# Patient Record
Sex: Female | Born: 1980 | Race: White | Hispanic: No | Marital: Married | Smoking: Never smoker
Health system: Southern US, Community
[De-identification: ages and names within clinical notes are randomized; demographics above are authoritative.]

## PROBLEM LIST (undated history)

## (undated) ENCOUNTER — Inpatient Hospital Stay (HOSPITAL_COMMUNITY): Payer: Self-pay

## (undated) DIAGNOSIS — Z5189 Encounter for other specified aftercare: Secondary | ICD-10-CM

## (undated) HISTORY — PX: WISDOM TOOTH EXTRACTION: SHX21

## (undated) HISTORY — DX: Encounter for other specified aftercare: Z51.89

## (undated) HISTORY — PX: NO PAST SURGERIES: SHX2092

---

## 2011-02-16 LAB — ABO/RH: RH Type: POSITIVE

## 2011-02-16 LAB — HEPATITIS B SURFACE ANTIGEN: Hepatitis B Surface Ag: NEGATIVE

## 2011-02-16 LAB — RUBELLA ANTIBODY, IGM: Rubella: NON-IMMUNE/NOT IMMUNE

## 2011-07-17 ENCOUNTER — Inpatient Hospital Stay (HOSPITAL_COMMUNITY)
Admission: AD | Admit: 2011-07-17 | Discharge: 2011-07-17 | Disposition: A | Discharge status: Home / Self Care | Payer: BC Managed Care – PPO | Source: Ambulatory Visit | Attending: Obstetrics and Gynecology | Admitting: Obstetrics and Gynecology

## 2011-07-17 ENCOUNTER — Encounter (HOSPITAL_COMMUNITY): Payer: Self-pay | Admitting: *Deleted

## 2011-07-17 DIAGNOSIS — O368190 Decreased fetal movements, unspecified trimester, not applicable or unspecified: Secondary | ICD-10-CM | POA: Insufficient documentation

## 2011-07-17 NOTE — ED Provider Notes (Signed)
History   30 yo G1P0 at 62 6/7 weeks presented with decreased fetal movement last 2 days.  Has felt movement,  "just not as much or as strong".  Denies contractions, leaking, or bleeding.  Has next visit at [facility] on Monday.    Pregnancy remarkable for:  Rubella non-immune  Chief Complaint  Patient presents with  . Decreased Fetal Movement     OB History    Grav Para Term Preterm Abortions TAB SAB Ect Mult Living   1             Attempted pregnancy X 1 1/2 years before conception  Past Medical History  Diagnosis Date  . No pertinent past medical history   Usual childhood illnesses UTI x1  Past Surgical History  Procedure Date  . No past surgeries   Wisdom teeth age 84 Tubes in ears as child  MGF Alzheimers, diabetes, heart disease  History  Substance Use Topics  . Smoking status: Never Smoker   . Smokeless tobacco: Not on file  . Alcohol Use: No    Allergies: No Known Allergies  Prescriptions prior to admission  Medication Sig Dispense Refill  . acetaminophen (TYLENOL) 325 MG tablet Take 650 mg by mouth every 6 (six) hours as needed.        . calcium carbonate (TUMS - DOSED IN MG ELEMENTAL CALCIUM) 500 MG chewable tablet Chew 1 tablet by mouth daily as needed. For heartburn.       . prenatal vitamin w/FE, FA (PRENATAL 1 + 1) 27-1 MG TABS Take 1 tablet by mouth daily.        . Ranitidine HCl (ZANTAC PO) Take 1 tablet by mouth daily as needed. For heartburn.         ROS:  Denies SOB, chest pain, leaking, bleeding.  Reports occasional low back pain, reflux, occasional nausea related to reflux.  Physical Exam   Blood pressure 121/84, pulse 63, temperature 97.9 F (36.6 C), temperature source Oral, resp. rate 18, height 5\' 1"  (1.549 m), weight 85.276 kg (188 lb). Chest clear Heart RRR without murmur Abd Gravid, NT Back Negative CVAT Pelvic deferred Ext WNL  FHR reactive, with audible fetal movement No UCs  Bedside U/S--vtx, multiple episodes of fetal  movement.  Patient now aware of FM.  Fluid appears adequate, anterior placenta  ED Course  IUP at 31 6/7 week Appropriate FM--patient now aware of FM  Plan: D/C home Monitor [facility]KC--call prn Keep scheduled appointment at [facility] on Monday.  [name], CNM 07/17/11 1340

## 2011-07-17 NOTE — Progress Notes (Signed)
Decreased fetal movement.  Low back ache started about an hour ago.  No bleeding or leaking, taken directly to rm

## 2011-07-17 NOTE — Progress Notes (Signed)
Pt in c/o decreased fetal movement x 2 days.  Reports lower back pain and increased discharge.  Denies any bleeding or leaking of fluid.

## 2011-07-27 ENCOUNTER — Inpatient Hospital Stay (HOSPITAL_COMMUNITY)
Admission: AD | Admit: 2011-07-27 | Discharge: 2011-08-02 | DRG: 372 | Disposition: A | Discharge status: Home / Self Care | Payer: BC Managed Care – PPO | Source: Ambulatory Visit | Attending: Obstetrics and Gynecology | Admitting: Obstetrics and Gynecology

## 2011-07-27 ENCOUNTER — Ambulatory Visit (HOSPITAL_COMMUNITY): Payer: BC Managed Care – PPO

## 2011-07-27 ENCOUNTER — Encounter (HOSPITAL_COMMUNITY): Payer: Self-pay | Admitting: *Deleted

## 2011-07-27 ENCOUNTER — Inpatient Hospital Stay (HOSPITAL_COMMUNITY): Payer: BC Managed Care – PPO

## 2011-07-27 DIAGNOSIS — O1490 Unspecified pre-eclampsia, unspecified trimester: Secondary | ICD-10-CM | POA: Diagnosis present

## 2011-07-27 DIAGNOSIS — O1414 Severe pre-eclampsia complicating childbirth: Principal | ICD-10-CM | POA: Diagnosis present

## 2011-07-27 DIAGNOSIS — IMO0002 Reserved for concepts with insufficient information to code with codable children: Secondary | ICD-10-CM | POA: Diagnosis present

## 2011-07-27 DIAGNOSIS — O4290 Premature rupture of membranes, unspecified as to length of time between rupture and onset of labor, unspecified weeks of gestation: Secondary | ICD-10-CM | POA: Diagnosis present

## 2011-07-27 DIAGNOSIS — O139 Gestational [pregnancy-induced] hypertension without significant proteinuria, unspecified trimester: Secondary | ICD-10-CM | POA: Diagnosis present

## 2011-07-27 HISTORY — DX: Unspecified pre-eclampsia, unspecified trimester: O14.90

## 2011-07-27 LAB — CBC
MCH: 30 pg (ref 26.0–34.0)
Platelets: 215 10*3/uL (ref 150–400)
RBC: 4.06 MIL/uL (ref 3.87–5.11)
RDW: 13.1 % (ref 11.5–15.5)

## 2011-07-27 LAB — STREP B DNA PROBE: GBS: NEGATIVE

## 2011-07-27 LAB — COMPREHENSIVE METABOLIC PANEL
Alkaline Phosphatase: 76 U/L (ref 39–117)
BUN: 11 mg/dL (ref 6–23)
GFR calc Af Amer: >90 mL/min (ref >90)
GFR calc non Af Amer: >90 mL/min (ref >90)
Glucose, Bld: 82 mg/dL (ref 70–99)
Potassium: 4.4 mEq/L (ref 3.5–5.1)
Total Protein: 6.3 g/dL (ref 6.0–8.3)

## 2011-07-27 MED ORDER — SODIUM CHLORIDE 0.9 % IV SOLN
2.0000 g | Freq: Four times a day (QID) | INTRAVENOUS | Status: AC
  Administered 2011-07-27 – 2011-07-29 (×8): 2 g via INTRAVENOUS
  Filled 2011-07-27 (×9): qty 2000

## 2011-07-27 MED ORDER — LABETALOL HCL 200 MG PO TABS
200.0000 mg | ORAL_TABLET | Freq: Two times a day (BID) | ORAL | Status: DC
  Administered 2011-07-27 – 2011-07-31 (×9): 200 mg via ORAL
  Filled 2011-07-27 (×10): qty 1

## 2011-07-27 MED ORDER — LACTATED RINGERS IV SOLN
INTRAVENOUS | Status: DC
  Administered 2011-07-28: via INTRAVENOUS
  Administered 2011-07-30: 1000 mL via INTRAVENOUS

## 2011-07-27 MED ORDER — SODIUM CHLORIDE 0.9 % IJ SOLN
3.0000 mL | INTRAMUSCULAR | Status: DC | PRN
  Administered 2011-07-28 (×2): 3 mL via INTRAVENOUS

## 2011-07-27 MED ORDER — DEXTROSE 5 % IV SOLN
500.0000 mg | Freq: Once | INTRAVENOUS | Status: AC
  Administered 2011-07-27: 500 mg via INTRAVENOUS
  Filled 2011-07-27: qty 500

## 2011-07-27 MED ORDER — PRENATAL PLUS 27-1 MG PO TABS
1.0000 | ORAL_TABLET | Freq: Every day | ORAL | Status: DC
Start: 2011-07-27 — End: 2011-07-27

## 2011-07-27 MED ORDER — AMOXICILLIN 500 MG PO CAPS
500.0000 mg | ORAL_CAPSULE | Freq: Three times a day (TID) | ORAL | Status: DC
  Administered 2011-07-29 – 2011-07-30 (×4): 500 mg via ORAL
  Filled 2011-07-27 (×6): qty 1

## 2011-07-27 MED ORDER — ACETAMINOPHEN 325 MG PO TABS
650.0000 mg | ORAL_TABLET | ORAL | Status: DC | PRN
  Administered 2011-07-27: 650 mg via ORAL
  Filled 2011-07-27 (×2): qty 2

## 2011-07-27 MED ORDER — DEXTROSE 5 % IV SOLN
500.0000 mg | INTRAVENOUS | Status: AC
  Administered 2011-07-27: 500 mg via INTRAVENOUS
  Filled 2011-07-27 (×2): qty 500

## 2011-07-27 MED ORDER — LACTATED RINGERS IV SOLN
INTRAVENOUS | Status: DC
  Administered 2011-07-27: 15:00:00 via INTRAVENOUS

## 2011-07-27 MED ORDER — COMPLETENATE 29-1 MG PO CHEW
1.0000 | CHEWABLE_TABLET | Freq: Every day | ORAL | Status: DC
  Administered 2011-07-27 – 2011-07-30 (×4): 1 via ORAL
  Filled 2011-07-27 (×5): qty 1

## 2011-07-27 MED ORDER — HYDROCHLOROTHIAZIDE 25 MG PO TABS
25.0000 mg | ORAL_TABLET | Freq: Every day | ORAL | Status: DC
  Administered 2011-07-27 – 2011-07-31 (×5): 25 mg via ORAL
  Filled 2011-07-27 (×5): qty 1

## 2011-07-27 MED ORDER — DOCUSATE SODIUM 100 MG PO CAPS
100.0000 mg | ORAL_CAPSULE | Freq: Every day | ORAL | Status: DC
  Administered 2011-07-28 – 2011-07-30 (×3): 100 mg via ORAL
  Filled 2011-07-27 (×5): qty 1

## 2011-07-27 MED ORDER — CALCIUM CARBONATE ANTACID 500 MG PO CHEW
2.0000 | CHEWABLE_TABLET | ORAL | Status: DC | PRN
  Administered 2011-07-27 – 2011-07-28 (×4): 400 mg via ORAL
  Filled 2011-07-27 (×5): qty 2

## 2011-07-27 MED ORDER — SODIUM CHLORIDE 0.9 % IJ SOLN
3.0000 mL | Freq: Two times a day (BID) | INTRAMUSCULAR | Status: DC
  Administered 2011-07-28 – 2011-07-30 (×5): 3 mL via INTRAVENOUS

## 2011-07-27 MED ORDER — ZOLPIDEM TARTRATE 10 MG PO TABS
10.0000 mg | ORAL_TABLET | Freq: Every evening | ORAL | Status: DC | PRN
  Administered 2011-07-31: 10 mg via ORAL
  Filled 2011-07-27 (×2): qty 1

## 2011-07-27 MED ORDER — BETAMETHASONE SOD PHOS & ACET 6 (3-3) MG/ML IJ SUSP
12.0000 mg | INTRAMUSCULAR | Status: AC
  Administered 2011-07-27 – 2011-07-28 (×2): 12 mg via INTRAMUSCULAR
  Filled 2011-07-27 (×2): qty 2

## 2011-07-27 NOTE — Consult Note (Signed)
[facility]  Neonatal Medicine Consultation       07/27/2011    3:52 PM  I was called at the request of the patient's obstetrician (Dr. Estanislado Pandy) to speak to this patient due to PROM at 33 weeks.  The pregnancy has been uncomplicated until now.  She is expecting a female child.  I reviewed expected hospital course and outcome for a premature baby born at 25-35 weeks with both the patient and her partner.  Mom plans to breast feed, so I encouraged that and described what will happen in terms of her using a breast pump, and our schedule for feeding her baby.  I reviewed respiratory distress that might occur, and our treatment.  I also described the increased infection risk from premature rupture of membranes, and how the baby will be evaluated and treated.    Expected length of stay will be dependent on when the baby delivers, but most of these babies go home at 35-[redacted] weeks gestation.  I described the rooming-in policy.  If he delivers after 35 weeks, it is possible he could go to the regular newborn nursery if he looks well.    _____________________ Electronically Signed By: Angelita Ingles, MD Neonatologist

## 2011-07-27 NOTE — Progress Notes (Signed)
Met with patient and husband to review 1. PPROM          2. PIH (labs normal except uric acid at 7) Reviewed plan of care: expectant management  until 36 weeks since fetal lung maturity unknown  (pt refused amniotic fluid collection) with ATB prophylaxis over next 7 days. Will deliver if signs of chorioamnionitis or abnormal fetal tracings. Also recommend cath U/A to r/o pre-eclampsia. Pt is reluctant to proceed. Informed may require 24 hour urine collection to determine proteinuria. Discussed at length. Aware our plan would be changed if superimposed pre-eclampsia.  Also reviewed starting anti-hypertensive meds with risks and benefits. Agreeable to start Labetalol 200 mg BID and HCTZ 25 mg daily.

## 2011-07-27 NOTE — H&P (Signed)
Kristine Mcclure is a 30 y.o. white female presenting at 33.2 weeks with CC of Leaking fluid since around 10:10, and continues to leak.  Denies VB or ctxs.  Reports intermittent low back pain & swelling in feet and legs with onset over the last week.  Denies PIH s/s. Seen in MAU at 31.6 weeks for decreased FM for the 2 days prior to that, and had reactive NST and bs u/s by [name] showing subjectively nml fluid and good fetal movement. BP in MAU at that visit = 121/84.   Fundal Height measuring 32 at her 30 week appt at [facility].  Presents to hospital with her husband & doula.  Pt is a 3rd grade teacher.   Maternal Medical History:  Reason for admission: Reason for admission: rupture of membranes.  Fetal activity: Perceived fetal activity is normal.   Last perceived fetal movement was within the past hour.    Prenatal complications: 1.  Rubella non-immune 2. Failed 1hr gtt 06/09/11 (=144); nml 3hr gtt 3.  MAU at 31.6 for decreased FM; nml since 4.  Increased BMI 5.  FH of dwarfism    OB History    Grav Para Term Preterm Abortions TAB SAB Ect Mult Living   1              Past Medical History  Diagnosis Date  . No pertinent past medical history    Past Surgical History  Procedure Date  . No past surgeries    Family History: family history is not on file. Social History:  reports that she has never smoked. She does not have any smokeless tobacco history on file. She reports that she does not drink alcohol or use illicit drugs.  Review of Systems  Constitutional: Negative.   Respiratory: Negative.   Cardiovascular: Negative.   Gastrointestinal: Negative.   Genitourinary: Negative.   Musculoskeletal: Positive for back pain.  Skin: Negative.     Dilation: Closed Effacement (%): 70 Station: -2 Exam by:: H Ronniesha Seibold CNM  Blood pressure 148/98, pulse 75, temperature 97.8 F (36.6 C), temperature source Oral, resp. rate 20, height 5\' 2"  (1.575 m), weight 90.992 kg (200 lb 9.6  oz), SpO2 95.00%. Maternal Exam:  Uterine Assessment: Contraction strength is mild.  Intermittent UI  Abdomen: Patient reports no abdominal tenderness. Estimated fetal weight is 5 lb 2oz (74%) on u/s today.   Fetal presentation: vertex  Introitus: Normal vulva. Ferning test: positive.  Nitrazine test: not done. Amniotic fluid character: clear.  Pelvis: adequate for delivery.   Cervix: Cervix evaluated by sterile speculum exam and digital exam.     Fetal Exam Fetal Monitor Review: Mode: ultrasound.   Baseline rate: 135.  Variability: moderate (6-25 bpm).   Pattern: accelerations present and no decelerations.    Fetal State Assessment: Category I - tracings are normal.     Physical Exam  Constitutional: She is oriented to person, place, and time. She appears well-developed and well-nourished. No distress.  Cardiovascular: Normal rate and regular rhythm.   Respiratory: Effort normal and breath sounds normal.  GI: Soft. Bowel sounds are normal.  Genitourinary: Vagina normal.       Cx:  FT/70/-2; posterior Moderate amt clear fluid in vault  Musculoskeletal: She exhibits edema.       3+ pitting edema in BLE up to knees  Neurological: She is alert and oriented to person, place, and time. She has normal reflexes.       DTR's 1+; no clonus  Skin:  Skin is warm and dry.  Psychiatric: She has a normal mood and affect. Her behavior is normal. Thought content normal.    .. Results for orders placed during the hospital encounter of 07/27/11 (from the past 24 hour(s))  LACTATE DEHYDROGENASE     Status: Normal   Collection Time   07/27/11 11:59 AM      Component Value Range   LD 189  94 - 250 (U/L)  URIC ACID     Status: Normal   Collection Time   07/27/11 11:59 AM      Component Value Range   Uric Acid, Serum 7.0  2.4 - 7.0 (mg/dL)  COMPREHENSIVE METABOLIC PANEL     Status: Abnormal   Collection Time   07/27/11 11:59 AM      Component Value Range   Sodium 134 (*) 135 - 145  (mEq/L)   Potassium 4.4  3.5 - 5.1 (mEq/L)   Chloride 103  96 - 112 (mEq/L)   CO2 24  19 - 32 (mEq/L)   Glucose, Bld 82  70 - 99 (mg/dL)   BUN 11  6 - 23 (mg/dL)   Creatinine, Ser 1.61  0.50 - 1.10 (mg/dL)   Calcium 09.6 (*) 8.4 - 10.5 (mg/dL)   Total Protein 6.3  6.0 - 8.3 (g/dL)   Albumin 2.8 (*) 3.5 - 5.2 (g/dL)   AST 34  0 - 37 (U/L)   ALT 34  0 - 35 (U/L)   Alkaline Phosphatase 76  39 - 117 (U/L)   Total Bilirubin 0.1 (*) 0.3 - 1.2 (mg/dL)   GFR calc non Af Amer >90  >90 (mL/min)   GFR calc Af Amer >90  >90 (mL/min)   U/S:  SIUP, Cephalic presentation, AFI=26.78 (96%); EFW=5+2 (74%); Anterior placenta above os; Cx measured translabially=1.5cm  Prenatal labs: ABO, Rh:  A positive Antibody:  negative Rubella:  non-immune RPR:   nonreactive HBsAg:   negative HIV:   nonreactive GBS:   unknown--collected 07/27/11 GC/CT--collected 07/27/11  Assessment/Plan: 1.  IUP at 33.2 2.  PPROM 3.  Polyhydramnios despite ROM 4.  High blood pressure 5.  LFT's & uric acid high normal; other PIH labs nml 6.  Cat I FHT  1.  Admit to antenatal with dr. Estanislado Pandy as attending; routine ante orders 2.  Begin Ampicillin per PPROM protocol; add Azithromycin 1gm IV x1 today and repeat dose in 5 days if hasn't delivered per new Up-to-date recommendation 3.   BMZ today & tomorrow 4.  Close monitoring of BP, s/s of labor, chorio, PreEclampsia 5.  Rec'd fluid collection for fetal Lung maturity, but pt and s.o. Declined. 6.  NICU consult 7.  MD to follow  Myrl Lazarus H 07/27/2011, 3:52 PM

## 2011-07-28 LAB — URINALYSIS, MICROSCOPIC ONLY
Bilirubin Urine: NEGATIVE
Ketones, ur: NEGATIVE mg/dL
Leukocytes, UA: NEGATIVE
Nitrite: NEGATIVE
Protein, ur: NEGATIVE mg/dL
Urobilinogen, UA: 0.2 mg/dL (ref 0.0–1.0)
pH: 5.5 (ref 5.0–8.0)

## 2011-07-28 LAB — GC/CHLAMYDIA PROBE AMP, GENITAL: GC Probe Amp, Genital: NEGATIVE

## 2011-07-28 MED ORDER — FAMOTIDINE 20 MG PO TABS
20.0000 mg | ORAL_TABLET | Freq: Two times a day (BID) | ORAL | Status: DC
  Administered 2011-07-28 – 2011-07-31 (×6): 20 mg via ORAL
  Filled 2011-07-28 (×6): qty 1

## 2011-07-28 MED ORDER — ONDANSETRON HCL 4 MG PO TABS
8.0000 mg | ORAL_TABLET | Freq: Three times a day (TID) | ORAL | Status: DC | PRN
  Administered 2011-07-28: 4 mg via ORAL
  Administered 2011-07-31: 8 mg via ORAL
  Filled 2011-07-28: qty 2
  Filled 2011-07-28: qty 1

## 2011-07-28 NOTE — Progress Notes (Signed)
UR Chart review completed.  

## 2011-07-28 NOTE — Progress Notes (Addendum)
Subjective:  The patient reports that she is feeling well. She denies contractions. She denies headaches, blurred vision, and right upper quadrant tenderness.  Objective:   The nonstress test is reactive. The patient has very few contractions.  BP 102/54  Pulse 92  Temp(Src) 98 F (36.7 C) (Oral)  Resp 18  Ht 5\' 1"  (1.549 m)  Wt 91.082 kg (200 lb 12.8 oz)  BMI 37.94 kg/m2  SpO2 95%  Chest is clear  Heart regular rate and rhythm  Abdomen nontender  Reflexes are normal  Mild lower extremity edema  Gonorrhea negative  Chlamydia negative  Beta strep is pending  Assessment:  33 week and 3 day gestation  Premature and preterm rupture membranes  Improved blood pressure  Plan:  Continue hospital observation.  Check beta strep culture when available. Continue antibiotics until that time.  Follow blood pressure closely. Currently there is no sign of preeclampsia. The patient declines catheterized urine evaluation.  Mylinda Latina.D.

## 2011-07-29 LAB — URINE CULTURE
Culture  Setup Time: 201210161352
Culture: NO GROWTH

## 2011-07-29 NOTE — Progress Notes (Signed)
Pt without complaints.  No  VB.  Good FM.  Pt continues to leak fluid BP 121/71  Pulse 72  Temp(Src) 97.9 F (36.6 C) (Oral)  Resp 18  Ht 5\' 1"  (1.549 m)  Wt 91.082 kg (200 lb 12.8 oz)  BMI 37.94 kg/m2  SpO2 95% FHTS 130 reactive Toco q irreg Pt in NAD CV RRR Lungs CTAB abd  Gravid soft and NT GU no vb EXt no calf tenderness @LABS @ Assessment and Plan PPROM at 334/7 weeks AFI was elvated on [place] tc repeat US before poss induciton at 34 weeks GBS P

## 2011-07-30 LAB — COMPREHENSIVE METABOLIC PANEL
AST: 47 U/L — ABNORMAL HIGH (ref 0–37)
Albumin: 2.6 g/dL — ABNORMAL LOW (ref 3.5–5.2)
BUN: 11 mg/dL (ref 6–23)
Calcium: 9.4 mg/dL (ref 8.4–10.5)
Creatinine, Ser: 0.81 mg/dL (ref 0.50–1.10)
Total Bilirubin: 0.2 mg/dL — ABNORMAL LOW (ref 0.3–1.2)
Total Protein: 5.9 g/dL — ABNORMAL LOW (ref 6.0–8.3)

## 2011-07-30 LAB — URIC ACID: Uric Acid, Serum: 7 mg/dL (ref 2.4–7.0)

## 2011-07-30 LAB — LACTATE DEHYDROGENASE: LDH: 206 U/L (ref 94–250)

## 2011-07-30 LAB — CBC
HCT: 32.9 % — ABNORMAL LOW (ref 36.0–46.0)
MCH: 30.3 pg (ref 26.0–34.0)
MCHC: 34 g/dL (ref 30.0–36.0)
MCV: 88.9 fL (ref 78.0–100.0)
Platelets: 203 10*3/uL (ref 150–400)
RDW: 13.2 % (ref 11.5–15.5)
WBC: 18.2 10*3/uL — ABNORMAL HIGH (ref 4.0–10.5)

## 2011-07-30 LAB — AMNISURE RUPTURE OF MEMBRANE (ROM) NOT AT ARMC: Amnisure ROM: POSITIVE

## 2011-07-30 MED ORDER — LABETALOL HCL 5 MG/ML IV SOLN
10.0000 mg | Freq: Once | INTRAVENOUS | Status: AC
  Administered 2011-07-30: 10 mg via INTRAVENOUS

## 2011-07-30 MED ORDER — LABETALOL HCL 5 MG/ML IV SOLN
INTRAVENOUS | Status: AC
  Administered 2011-07-30: 10 mg via INTRAVENOUS
  Filled 2011-07-30: qty 4

## 2011-07-30 MED ORDER — MAGNESIUM SULFATE 40 G IN LACTATED RINGERS - SIMPLE
2.0000 g/h | INTRAVENOUS | Status: DC
  Administered 2011-07-31 – 2011-08-01 (×2): 2 g/h via INTRAVENOUS
  Filled 2011-07-30 (×2): qty 500

## 2011-07-30 MED ORDER — MAGNESIUM SULFATE BOLUS VIA INFUSION
4.0000 g | Freq: Once | INTRAVENOUS | Status: AC
  Administered 2011-07-30: 4 g via INTRAVENOUS
  Filled 2011-07-30: qty 500

## 2011-07-30 NOTE — Progress Notes (Signed)
Pt verbalized "feels better"  Pulse ox still in place.  Instructed pt to call if symptoms arise and/ or worsen

## 2011-07-30 NOTE — Progress Notes (Addendum)
Pt called out to report cough and tight feeling in chest. Pt describes as not painful.  Per pt relieved upon coughing. Pt turned to back head of bed elevated.   Lungs assessed negative for crackle/rales.  Non-productive cough. Pt given and instructed on incentive spirometer.  Pulse ox applied and assessing

## 2011-07-30 NOTE — Progress Notes (Signed)
Kristine Mcclure is a 30 y.o. G1P0 at [redacted]w[redacted]d admitted for PPROM.  Subjective: Denies HA, visual changes or abd pain.  Reports persistent leakage of clear fluid and denies ctxs.  Reports good FM.  Objective: BP 142/87  Pulse 75  Temp(Src) 97.4 F (36.3 C) (Oral)  Resp 20  Ht 5\' 1"  (1.549 m)  Wt 89.994 kg (198 lb 6.4 oz)  BMI 37.49 kg/m2  SpO2 93%      FHT:  FHR: 130s bpm, variability: moderate,  accelerations:  Present,  decelerations:  Absent UC:   rare SVE:   Dilation: Closed Effacement (%): 70 Station: -2 Exam by:: H STEELMAN CNM   Labs: Lab Results  Component Value Date   WBC 18.2* 07/30/2011   HGB 11.2* 07/30/2011   HCT 32.9* 07/30/2011   MCV 88.9 07/30/2011   PLT 203 07/30/2011   Mildly Elevated LFTs  Assessment / Plan: P0 at 4 5/7wks admitted with PPROM with elevated BPs today and elevated LFTs.  Pt is currently asymptomatic and undergoing 24hr urine collection however in light of abnormal labs and elevated BP requiring IV labetalol, I will start MGSO4 while 24hr urine being collected.  Fetal status is reassuring.    Purcell Nails 07/30/2011, 4:33 PM

## 2011-07-30 NOTE — Progress Notes (Signed)
Dr.  Normand Sloop verbalized to continue to monitor BP's will discuss plan with pt in AM

## 2011-07-31 ENCOUNTER — Other Ambulatory Visit: Payer: Self-pay | Admitting: Obstetrics and Gynecology

## 2011-07-31 ENCOUNTER — Encounter (HOSPITAL_COMMUNITY): Payer: Self-pay | Admitting: *Deleted

## 2011-07-31 LAB — CBC
HCT: 34 % — ABNORMAL LOW (ref 36.0–46.0)
Hemoglobin: 11.5 g/dL — ABNORMAL LOW (ref 12.0–15.0)
MCH: 30.3 pg (ref 26.0–34.0)
MCHC: 33.8 g/dL (ref 30.0–36.0)
MCV: 89.3 fL (ref 78.0–100.0)
Platelets: 216 10*3/uL (ref 150–400)
RBC: 3.64 MIL/uL — ABNORMAL LOW (ref 3.87–5.11)
RDW: 13.3 % (ref 11.5–15.5)
WBC: 13.8 10*3/uL — ABNORMAL HIGH (ref 4.0–10.5)

## 2011-07-31 LAB — COMPREHENSIVE METABOLIC PANEL
ALT: 60 U/L — ABNORMAL HIGH (ref 0–35)
ALT: 88 U/L — ABNORMAL HIGH (ref 0–35)
AST: 55 U/L — ABNORMAL HIGH (ref 0–37)
Albumin: 2.5 g/dL — ABNORMAL LOW (ref 3.5–5.2)
Albumin: 2.7 g/dL — ABNORMAL LOW (ref 3.5–5.2)
Alkaline Phosphatase: 85 U/L (ref 39–117)
CO2: 28 mEq/L (ref 19–32)
Calcium: 9.1 mg/dL (ref 8.4–10.5)
Chloride: 103 mEq/L (ref 96–112)
Creatinine, Ser: 0.82 mg/dL (ref 0.50–1.10)
Potassium: 4.3 mEq/L (ref 3.5–5.1)
Sodium: 137 mEq/L (ref 135–145)
Sodium: 134 mEq/L — ABNORMAL LOW (ref 135–145)
Total Protein: 5.8 g/dL — ABNORMAL LOW (ref 6.0–8.3)

## 2011-07-31 LAB — CULTURE, BETA STREP (GROUP B ONLY)

## 2011-07-31 LAB — RPR: RPR Ser Ql: NONREACTIVE

## 2011-07-31 MED ORDER — TERBUTALINE SULFATE 1 MG/ML IJ SOLN: 0.2500 mg | Freq: Once | INTRAMUSCULAR | Status: AC | PRN

## 2011-07-31 MED ORDER — LABETALOL HCL 5 MG/ML IV SOLN
10.0000 mg | INTRAVENOUS | Status: DC | PRN
  Administered 2011-07-31 (×3): 10 mg via INTRAVENOUS
  Filled 2011-07-31 (×2): qty 4

## 2011-07-31 MED ORDER — IBUPROFEN 600 MG PO TABS
600.0000 mg | ORAL_TABLET | Freq: Four times a day (QID) | ORAL | Status: DC | PRN
  Administered 2011-07-31: 600 mg via ORAL
  Filled 2011-07-31: qty 1

## 2011-07-31 MED ORDER — FLEET ENEMA 7-19 GM/118ML RE ENEM: 1.0000 | ENEMA | RECTAL | Status: DC | PRN

## 2011-07-31 MED ORDER — OXYCODONE-ACETAMINOPHEN 5-325 MG PO TABS
2.0000 | ORAL_TABLET | ORAL | Status: DC | PRN
  Administered 2011-07-31: 2 via ORAL
  Filled 2011-07-31: qty 2

## 2011-07-31 MED ORDER — ONDANSETRON HCL 4 MG/2ML IJ SOLN: 4.0000 mg | Freq: Four times a day (QID) | INTRAMUSCULAR | Status: DC | PRN

## 2011-07-31 MED ORDER — ACETAMINOPHEN 325 MG PO TABS: 650.0000 mg | ORAL_TABLET | ORAL | Status: DC | PRN

## 2011-07-31 MED ORDER — OXYTOCIN 20 UNITS IN LACTATED RINGERS INFUSION - SIMPLE
125.0000 mL/h | Freq: Once | INTRAVENOUS | Status: AC
  Administered 2011-07-31: 125 mL/h via INTRAVENOUS

## 2011-07-31 MED ORDER — LACTATED RINGERS IV SOLN: 500.0000 mL | INTRAVENOUS | Status: DC | PRN

## 2011-07-31 MED ORDER — LACTATED RINGERS IV SOLN
INTRAVENOUS | Status: DC
  Administered 2011-07-31 (×2): via INTRAVENOUS

## 2011-07-31 MED ORDER — BUTORPHANOL TARTRATE 2 MG/ML IJ SOLN: 1.0000 mg | INTRAMUSCULAR | Status: DC | PRN

## 2011-07-31 MED ORDER — OXYTOCIN 20 UNITS IN LACTATED RINGERS INFUSION - SIMPLE
1.0000 m[IU]/min | INTRAVENOUS | Status: DC
  Administered 2011-07-31: 17 m[IU]/min via INTRAVENOUS
  Administered 2011-07-31: 1 m[IU]/min via INTRAVENOUS
  Administered 2011-07-31: 15 m[IU]/min via INTRAVENOUS
  Filled 2011-07-31: qty 1000

## 2011-07-31 MED ORDER — OXYTOCIN BOLUS FROM INFUSION
500.0000 mL | Freq: Once | INTRAVENOUS | Status: DC
  Filled 2011-07-31: qty 500
  Filled 2011-07-31: qty 1000

## 2011-07-31 MED ORDER — LIDOCAINE HCL (PF) 1 % IJ SOLN
30.0000 mL | INTRAMUSCULAR | Status: DC | PRN
  Filled 2011-07-31: qty 30

## 2011-07-31 MED ORDER — CITRIC ACID-SODIUM CITRATE 334-500 MG/5ML PO SOLN: 30.0000 mL | ORAL | Status: DC | PRN

## 2011-07-31 NOTE — Progress Notes (Signed)
This note also relates to the following rows which could not be included: Pulse Rate - Cannot attach notes to rows marked as read only SpO2 - Cannot attach notes to rows marked as read only NICU team in room for delivery

## 2011-07-31 NOTE — Progress Notes (Signed)
Dr. Shellee Milo calls into check on pt.  B/p's given, fhr and uc pattern. Md also aware of labs and Mag. Level.  No new orders at this time.

## 2011-07-31 NOTE — Progress Notes (Signed)
ABI SHOULTS is a 30 y.o. G1P0 at 62w6dadmitted for induction of labor due to Pre-eclamptic toxemia of pregnancy..  Subjective:  Comfortable with  Labor support without medications. Pitocin at 12 mU/min Contractions every 2-5 minutes, lasting 30 seconds, intensity 1/10    Objective: BP 153/85  Pulse 71  Temp(Src) 98.3 F (36.8 C) (Oral)  Urine output: 60-175 cc/hr   FHT:  FHR: 125 bpm, variability: moderate,  accelerations:  Present,  decelerations:  Absent SVE:   Dilation: 3 Effacement (%): 100 Station: -3 Exam by:: Dr. 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 Rupture with pudendal block needle. Abundant clear fluid.  Labs: Lab Results  Component Value Date   WBC 13.8* 07/31/2011   HGB 10.9* 07/31/2011   HCT 32.5* 07/31/2011   MCV 89.3 07/31/2011   PLT 216 07/31/2011    Assessment / Plan: Protracted latent phase Fetal Wellbeing: reassuring Anticipated MOD:  NSVD  Repeat PIH labs.  Kaylee Wombles A 07/31/2011, 6:16 PM

## 2011-07-31 NOTE — Progress Notes (Signed)
Hospital day # 4 pregnancy at [redacted]w[redacted]d   Magnesium Sulfate at 2g/hr  S: Not well. Vomiting this am. Headache on/off. Blurred vision. No scotoma. Not feeling baby move this am.      Contractions:none      Vaginal bleeding:none now       Vaginal discharge: no significant change  O: BP 142/94  Pulse 81  Temp(Src) 97.7 F (36.5 C) (Oral)  Resp 18  Ht 5\' 1"  (1.549 m)  Wt 89.994 kg (198 lb 6.4 oz)  BMI 37.49 kg/m2  SpO2 93%      Fetal tracings:Fetal heart variability: moderate Fetal Heart Rate decelerations: none Fetal Heart Rate accelerations: yes Baseline FHR: 130 per minute reviewed and reassuring      Uterus gravid and non-tender No RUQ pain      Lungs: clear      Extremities: edema 3+ DTR 1/4 no clonus       VE: 2/100/Vtx  -3 station with BBOW  LABS:  Platelets  216  AST 55  ALT  60  Mg  6.0  GBS negative  Gc / Chlamydia  negative  A: [redacted]w[redacted]d with PProm now with severe pre-eclampsia     rapidly worsening  P: Discussed with Dr Sherrie George who agrees with Induction of labor today. Will D/C antibiotics. Will maintain Magnesium sulfate until 24 hours post-partum. Pt and husband updated on progress and plan and are agreeable. Questions answered.  Aundreya Souffrant A  MD 07/31/2011 9:57 AM

## 2011-07-31 NOTE — Progress Notes (Signed)
Delivery Note  At 22:41  a viable female baby named Mills Koller was delivered via Spontaneous Vaginal Delivery (Presentation: LOA  ) with NICU team in the room.   APGAR: 9,9 ,   weight . 4 lbs 14 oz    Placenta status:intact ,spontaneously expelled .  Cord:3 Vx  Sent to pathology.   Cord pH: 7.364  Anesthesia:  Local 1% lidocaine Lacerations: vaginal Suture Repair: 3-0 Monocryl Est. Blood Loss (mL): 700  Mom to AICU. BP 146/85   Baby to NICU.  Edon Hoadley A 07/31/2011, 11:45 PM

## 2011-07-31 NOTE — Plan of Care (Signed)
Problem: Consults Goal: Birthing Suites Patient Information Press F2 to bring up selections list Outcome: Completed/Met Date Met:  07/31/11  Pt < [redacted] weeks EGA

## 2011-08-01 LAB — COMPREHENSIVE METABOLIC PANEL
ALT: 76 U/L — ABNORMAL HIGH (ref 0–35)
AST: 67 U/L — ABNORMAL HIGH (ref 0–37)
CO2: 28 mEq/L (ref 19–32)
Calcium: 8 mg/dL — ABNORMAL LOW (ref 8.4–10.5)
Creatinine, Ser: 0.94 mg/dL (ref 0.50–1.10)
GFR calc Af Amer: >90 mL/min (ref >90)
GFR calc non Af Amer: 81 mL/min — ABNORMAL LOW (ref >90)
Sodium: 132 mEq/L — ABNORMAL LOW (ref 135–145)
Total Protein: 4.8 g/dL — ABNORMAL LOW (ref 6.0–8.3)

## 2011-08-01 LAB — CBC
Platelets: 220 10*3/uL (ref 150–400)
RBC: 2.81 MIL/uL — ABNORMAL LOW (ref 3.87–5.11)
RDW: 12.9 % (ref 11.5–15.5)
WBC: 17.9 10*3/uL — ABNORMAL HIGH (ref 4.0–10.5)

## 2011-08-01 LAB — MAGNESIUM: Magnesium: 6.9 mg/dL (ref 1.5–2.5)

## 2011-08-01 MED ORDER — SIMETHICONE 80 MG PO CHEW: 80.0000 mg | CHEWABLE_TABLET | ORAL | Status: DC | PRN

## 2011-08-01 MED ORDER — BENZOCAINE-MENTHOL 20-0.5 % EX AERO
1.0000 "application " | INHALATION_SPRAY | CUTANEOUS | Status: DC | PRN
  Administered 2011-08-01: 1 via TOPICAL
  Filled 2011-08-01: qty 56

## 2011-08-01 MED ORDER — DIPHENHYDRAMINE HCL 25 MG PO CAPS: 25.0000 mg | ORAL_CAPSULE | Freq: Four times a day (QID) | ORAL | Status: DC | PRN

## 2011-08-01 MED ORDER — SENNOSIDES-DOCUSATE SODIUM 8.6-50 MG PO TABS
2.0000 | ORAL_TABLET | Freq: Every day | ORAL | Status: DC
  Administered 2011-08-01: 2 via ORAL

## 2011-08-01 MED ORDER — MAGNESIUM SULFATE 40 G IN LACTATED RINGERS - SIMPLE: 2.0000 g/h | INTRAVENOUS | Status: AC

## 2011-08-01 MED ORDER — DIBUCAINE 1 % RE OINT
1.0000 "application " | TOPICAL_OINTMENT | RECTAL | Status: DC | PRN
  Filled 2011-08-01: qty 28

## 2011-08-01 MED ORDER — ONDANSETRON HCL 4 MG/2ML IJ SOLN: 4.0000 mg | INTRAMUSCULAR | Status: DC | PRN

## 2011-08-01 MED ORDER — FERROUS SULFATE 325 (65 FE) MG PO TABS
325.0000 mg | ORAL_TABLET | Freq: Two times a day (BID) | ORAL | Status: DC
  Administered 2011-08-01 – 2011-08-02 (×4): 325 mg via ORAL
  Filled 2011-08-01 (×5): qty 1

## 2011-08-01 MED ORDER — SODIUM CHLORIDE 0.9 % IJ SOLN
3.0000 mL | Freq: Two times a day (BID) | INTRAMUSCULAR | Status: DC
  Administered 2011-08-01: 3 mL via INTRAVENOUS

## 2011-08-01 MED ORDER — BENZOCAINE-MENTHOL 20-0.5 % EX AERO
INHALATION_SPRAY | CUTANEOUS | Status: AC
  Filled 2011-08-01: qty 56

## 2011-08-01 MED ORDER — IBUPROFEN 600 MG PO TABS
600.0000 mg | ORAL_TABLET | Freq: Four times a day (QID) | ORAL | Status: DC
  Administered 2011-08-01 – 2011-08-02 (×7): 600 mg via ORAL
  Filled 2011-08-01 (×7): qty 1

## 2011-08-01 MED ORDER — LANOLIN HYDROUS EX OINT: TOPICAL_OINTMENT | CUTANEOUS | Status: DC | PRN

## 2011-08-01 MED ORDER — ZOLPIDEM TARTRATE 5 MG PO TABS: 5.0000 mg | ORAL_TABLET | Freq: Every evening | ORAL | Status: DC | PRN

## 2011-08-01 MED ORDER — ONDANSETRON HCL 4 MG PO TABS: 4.0000 mg | ORAL_TABLET | ORAL | Status: DC | PRN

## 2011-08-01 MED ORDER — PRENATAL PLUS 27-1 MG PO TABS
1.0000 | ORAL_TABLET | Freq: Every day | ORAL | Status: DC
  Administered 2011-08-01 – 2011-08-02 (×2): 1 via ORAL
  Filled 2011-08-01 (×2): qty 1

## 2011-08-01 MED ORDER — WITCH HAZEL-GLYCERIN EX PADS: 1.0000 "application " | MEDICATED_PAD | CUTANEOUS | Status: DC | PRN

## 2011-08-01 MED ORDER — LACTATED RINGERS IV SOLN
INTRAVENOUS | Status: AC
  Administered 2011-08-01 (×2): via INTRAVENOUS

## 2011-08-01 MED ORDER — OXYCODONE-ACETAMINOPHEN 5-325 MG PO TABS: 1.0000 | ORAL_TABLET | ORAL | Status: DC | PRN

## 2011-08-01 MED ORDER — TETANUS-DIPHTH-ACELL PERTUSSIS 5-2.5-18.5 LF-MCG/0.5 IM SUSP
0.5000 mL | Freq: Once | INTRAMUSCULAR | Status: AC
  Administered 2011-08-02: 0.5 mL via INTRAMUSCULAR
  Filled 2011-08-01: qty 0.5

## 2011-08-01 NOTE — Progress Notes (Signed)
Attempt to get pt up but passes out in bed, vs taken, ammonia given and pt wakes right up.  Pt states she is just not feeling well.  Pt put back to bed

## 2011-08-01 NOTE — Progress Notes (Signed)
Pt transferred to [facility]

## 2011-08-01 NOTE — Progress Notes (Signed)
Infant taken to NICU per NICU team

## 2011-08-01 NOTE — Progress Notes (Signed)
Post Partum Day 1  Magnesium sulfate 2g/hr Subjective:  Well. Lochia are normal. Voiding, ambulating, tolerating normal diet. breastpumping going well. Denies H/A, RUQ pain. Still some blurry vision. Baby doing very well in NICU on room air.  Objective: Blood pressure 123/76, pulse 78, temperature 97.8 F (36.6 C), temperature source Oral, resp. rate 18, height 5\' 1"  (1.549 m), weight 84.959 kg (187 lb 4.8 oz), SpO2 99.00%, unknown if currently breastpumping.  Physical Exam:  General: normal Lungs: clear Lochia: appropriate Uterine Fundus: 0/1 firm non-tender  Extremities: No evidence of DVT seen on physical exam. Edema 3+. DTR2/4 no clonus  LABS: MgSO4   6.9            Plt:   220            AST:  67            ALT:   76  Urine output:  75-100 cc/hr     Basename 08/01/11 0528 07/31/11 1822  HGB 8.5* 11.5*  HCT 25.0* 34.0*    Assessment/Plan  Severe pre-eclampsia gradually improving D/C MgSO4 at 22:30 Labs in am  Anticipate discharge possibly tomorrow    LOS: 5 days   Kely Dohn A MD 08/01/2011, 12:59 PM

## 2011-08-02 ENCOUNTER — Ambulatory Visit (HOSPITAL_COMMUNITY)
Admission: RE | Admit: 2011-08-02 | Discharge: 2011-08-02 | Disposition: A | Discharge status: Home / Self Care | Payer: BC Managed Care – PPO | Source: Ambulatory Visit | Attending: Obstetrics and Gynecology | Admitting: Obstetrics and Gynecology

## 2011-08-02 DIAGNOSIS — O923 Agalactia: Secondary | ICD-10-CM | POA: Insufficient documentation

## 2011-08-02 LAB — COMPREHENSIVE METABOLIC PANEL WITH GFR
ALT: 51 U/L — ABNORMAL HIGH (ref 0–35)
AST: 32 U/L (ref 0–37)
Albumin: 1.9 g/dL — ABNORMAL LOW (ref 3.5–5.2)
Alkaline Phosphatase: 58 U/L (ref 39–117)
BUN: 13 mg/dL (ref 6–23)
CO2: 30 meq/L (ref 19–32)
Calcium: 7.6 mg/dL — ABNORMAL LOW (ref 8.4–10.5)
Chloride: 102 meq/L (ref 96–112)
Creatinine, Ser: 1.03 mg/dL (ref 0.50–1.10)
GFR calc Af Amer: 84 mL/min — ABNORMAL LOW
GFR calc non Af Amer: 73 mL/min — ABNORMAL LOW
Glucose, Bld: 87 mg/dL (ref 70–99)
Potassium: 3.8 meq/L (ref 3.5–5.1)
Sodium: 137 meq/L (ref 135–145)
Total Bilirubin: 0.1 mg/dL — ABNORMAL LOW (ref 0.3–1.2)
Total Protein: 4.3 g/dL — ABNORMAL LOW (ref 6.0–8.3)

## 2011-08-02 LAB — CBC
MCH: 30.4 pg (ref 26.0–34.0)
MCHC: 33.6 g/dL (ref 30.0–36.0)
MCV: 90.3 fL (ref 78.0–100.0)
Platelets: 246 10*3/uL (ref 150–400)
RDW: 13.5 % (ref 11.5–15.5)

## 2011-08-02 MED ORDER — IBUPROFEN 600 MG PO TABS: 600.0000 mg | ORAL_TABLET | Freq: Four times a day (QID) | ORAL | Status: AC

## 2011-08-02 MED ORDER — OXYCODONE-ACETAMINOPHEN 5-325 MG PO TABS: 1.0000 | ORAL_TABLET | ORAL | Status: AC | PRN

## 2011-08-02 MED ORDER — MEASLES, MUMPS & RUBELLA VAC ~~LOC~~ INJ
0.5000 mL | INJECTION | Freq: Once | SUBCUTANEOUS | Status: AC
  Administered 2011-08-02: 0.5 mL via SUBCUTANEOUS
  Filled 2011-08-02: qty 0.5

## 2011-08-02 NOTE — Progress Notes (Signed)
CSW attempted x2 to meet with patient.  Will have weekday CSW follow-up with consult.

## 2011-08-02 NOTE — Discharge Summary (Signed)
Obstetric Discharge Summary  Reason for Admission: rupture of membranes and PIH Prenatal Procedures: Antibiotic prophylaxis Intrapartum Procedures: spontaneous vaginal delivery and Magnesium Sulfate by Silverio Lay MD Postpartum Procedures: Magnesium Sulfate for 24 hours post-partum Complications-Operative and Postpartum: none  Hemoglobin  Date Value Range Status  08/02/2011 7.2* 12.0-15.0 (g/dL) Final     HCT  Date Value Range Status  08/02/2011 21.4* 36.0-46.0 (%) Final    Discharge Diagnoses: PROM x5 days, Preelampsia and pre-term delivery at 33 weeks 6 days  Discharge Information:BP check by [facility] nurse this week  Date: 08/02/2011 Activity: unrestricted Diet: routine Medications: Ibuprofen, Iron and Percocet Condition: stable  Breastfeeding: yes  Instructions: refer to practice specific booklet Discharge to: home   Newborn Data: Live born  Information for the patient's newborn:  Shere, Eisenhart [[id number]]  Female named Brighton  APGAR 9,9,       weight ;4 lbs 14 oz  Staying in NICU  [facility] A MD 08/02/2011, 2:19 PM

## 2011-08-02 NOTE — Progress Notes (Signed)
08/02/11 1540 Pt transferred ambulatory to WU #[id number] & then immediately went to NICU. Verbal SBar report given to Ginette Otto, RN.

## 2011-08-02 NOTE — Progress Notes (Signed)
Post Partum Day 2  Magnesium sulfate  D/C last night at 22:30 Subjective:  Well. Lochia are normal. Voiding, ambulating, tolerating normal diet. breastpumping going well. Denies H/A, RUQ pain.Normal vision. Baby doing very well in NICU on room air.  Objective: Blood pressure 110/67  Physical Exam:  General: normal Lochia: appropriate Uterine Fundus: 0/1 firm non-tender  Extremities: No evidence of DVT seen on physical exam. Edema 2+  LABS:  Hgb 7.2 with normal orthostatics             Plt:   246            AST:  32            ALT:   51  I/O: -2218 cc since birth       Assessment/Plan  Severe pre-eclampsia resolving D/C home today   LOS: 6 days

## 2011-08-02 NOTE — Consult Note (Signed)
Mom to be discharged today.  Went to NICU to discuss the pros and cons of renting a DEBP vs. Purchasing a DEBP.  Mom decided to rent a [employer] DEBP. Encouraged mom to continue pumping 8-12 times in 24 hrs, use hand massage before, and to bring her pump tubing and parts to NICU while visiting with baby so she can utilize the pumping room.  Mom obtaining drops of colostrum when pumping. Mom told to call us if any question or problem arises.  Encouragement and support offered.

## 2011-08-03 LAB — PROTEIN, URINE, 24 HOUR
Collection Interval-UPROT: 24 hours
Protein, Urine: 14 mg/dL
Urine Total Volume-UPROT: 3375 mL

## 2011-09-02 ENCOUNTER — Encounter (HOSPITAL_COMMUNITY)
Admission: RE | Admit: 2011-09-02 | Discharge: 2011-09-02 | Disposition: A | Discharge status: Home / Self Care | Payer: BC Managed Care – PPO | Source: Ambulatory Visit | Attending: Obstetrics and Gynecology | Admitting: Obstetrics and Gynecology

## 2011-09-02 DIAGNOSIS — O923 Agalactia: Secondary | ICD-10-CM | POA: Insufficient documentation

## 2011-10-03 ENCOUNTER — Encounter (HOSPITAL_COMMUNITY)
Admission: RE | Admit: 2011-10-03 | Discharge: 2011-10-03 | Disposition: A | Discharge status: Home / Self Care | Payer: BC Managed Care – PPO | Source: Ambulatory Visit | Attending: Obstetrics and Gynecology | Admitting: Obstetrics and Gynecology

## 2011-10-03 DIAGNOSIS — O923 Agalactia: Secondary | ICD-10-CM | POA: Insufficient documentation

## 2011-11-03 ENCOUNTER — Encounter (HOSPITAL_COMMUNITY)
Admission: RE | Admit: 2011-11-03 | Discharge: 2011-11-03 | Disposition: A | Discharge status: Home / Self Care | Payer: BC Managed Care – PPO | Source: Ambulatory Visit | Attending: Obstetrics and Gynecology | Admitting: Obstetrics and Gynecology

## 2011-11-03 DIAGNOSIS — O923 Agalactia: Secondary | ICD-10-CM | POA: Insufficient documentation

## 2011-12-04 ENCOUNTER — Encounter (HOSPITAL_COMMUNITY)
Admission: RE | Admit: 2011-12-04 | Discharge: 2011-12-04 | Disposition: A | Discharge status: Home / Self Care | Payer: BC Managed Care – PPO | Source: Ambulatory Visit | Attending: Obstetrics and Gynecology | Admitting: Obstetrics and Gynecology

## 2011-12-04 DIAGNOSIS — O923 Agalactia: Secondary | ICD-10-CM | POA: Insufficient documentation

## 2011-12-31 ENCOUNTER — Ambulatory Visit (INDEPENDENT_AMBULATORY_CARE_PROVIDER_SITE_OTHER): Payer: BC Managed Care – PPO | Admitting: Obstetrics and Gynecology

## 2011-12-31 DIAGNOSIS — Z01419 Encounter for gynecological examination (general) (routine) without abnormal findings: Secondary | ICD-10-CM

## 2012-01-03 ENCOUNTER — Encounter (HOSPITAL_COMMUNITY)
Admission: RE | Admit: 2012-01-03 | Discharge: 2012-01-03 | Disposition: A | Discharge status: Home / Self Care | Payer: BC Managed Care – PPO | Source: Ambulatory Visit | Attending: Obstetrics and Gynecology | Admitting: Obstetrics and Gynecology

## 2012-01-03 DIAGNOSIS — O923 Agalactia: Secondary | ICD-10-CM | POA: Insufficient documentation

## 2012-02-03 ENCOUNTER — Encounter (HOSPITAL_COMMUNITY)
Admission: RE | Admit: 2012-02-03 | Discharge: 2012-02-03 | Disposition: A | Discharge status: Home / Self Care | Payer: BC Managed Care – PPO | Source: Ambulatory Visit | Attending: Obstetrics and Gynecology | Admitting: Obstetrics and Gynecology

## 2012-02-03 DIAGNOSIS — O923 Agalactia: Secondary | ICD-10-CM | POA: Insufficient documentation

## 2012-03-05 ENCOUNTER — Encounter (HOSPITAL_COMMUNITY)
Admission: RE | Admit: 2012-03-05 | Discharge: 2012-03-05 | Disposition: A | Discharge status: Home / Self Care | Payer: BC Managed Care – PPO | Source: Ambulatory Visit | Attending: Obstetrics and Gynecology | Admitting: Obstetrics and Gynecology

## 2012-03-05 DIAGNOSIS — O923 Agalactia: Secondary | ICD-10-CM | POA: Insufficient documentation

## 2012-04-05 ENCOUNTER — Encounter (HOSPITAL_COMMUNITY)
Admission: RE | Admit: 2012-04-05 | Discharge: 2012-04-05 | Disposition: A | Discharge status: Home / Self Care | Payer: BC Managed Care – PPO | Source: Ambulatory Visit | Attending: Obstetrics and Gynecology | Admitting: Obstetrics and Gynecology

## 2012-04-05 DIAGNOSIS — O923 Agalactia: Secondary | ICD-10-CM | POA: Insufficient documentation

## 2012-05-06 ENCOUNTER — Encounter (HOSPITAL_COMMUNITY)
Admission: RE | Admit: 2012-05-06 | Discharge: 2012-05-06 | Disposition: A | Discharge status: Home / Self Care | Payer: BC Managed Care – PPO | Source: Ambulatory Visit | Attending: Obstetrics and Gynecology | Admitting: Obstetrics and Gynecology

## 2012-05-06 DIAGNOSIS — O923 Agalactia: Secondary | ICD-10-CM | POA: Insufficient documentation

## 2012-06-06 ENCOUNTER — Encounter (HOSPITAL_COMMUNITY)
Admission: RE | Admit: 2012-06-06 | Discharge: 2012-06-06 | Disposition: A | Discharge status: Home / Self Care | Payer: BC Managed Care – PPO | Source: Ambulatory Visit | Attending: Obstetrics and Gynecology | Admitting: Obstetrics and Gynecology

## 2012-06-06 DIAGNOSIS — O923 Agalactia: Secondary | ICD-10-CM | POA: Insufficient documentation

## 2012-07-07 ENCOUNTER — Encounter (HOSPITAL_COMMUNITY)
Admission: RE | Admit: 2012-07-07 | Discharge: 2012-07-07 | Disposition: A | Discharge status: Home / Self Care | Payer: BC Managed Care – PPO | Source: Ambulatory Visit | Attending: Obstetrics and Gynecology | Admitting: Obstetrics and Gynecology

## 2012-07-07 DIAGNOSIS — O923 Agalactia: Secondary | ICD-10-CM | POA: Insufficient documentation

## 2012-08-07 ENCOUNTER — Encounter (HOSPITAL_COMMUNITY)
Admission: RE | Admit: 2012-08-07 | Discharge: 2012-08-07 | Disposition: A | Discharge status: Home / Self Care | Payer: BC Managed Care – PPO | Source: Ambulatory Visit | Attending: Obstetrics and Gynecology | Admitting: Obstetrics and Gynecology

## 2012-08-07 DIAGNOSIS — O923 Agalactia: Secondary | ICD-10-CM | POA: Insufficient documentation

## 2012-09-07 ENCOUNTER — Encounter (HOSPITAL_COMMUNITY)
Admission: RE | Admit: 2012-09-07 | Discharge: 2012-09-07 | Disposition: A | Discharge status: Home / Self Care | Payer: BC Managed Care – PPO | Source: Ambulatory Visit | Attending: Obstetrics and Gynecology | Admitting: Obstetrics and Gynecology

## 2012-09-07 DIAGNOSIS — O923 Agalactia: Secondary | ICD-10-CM | POA: Insufficient documentation

## 2012-10-07 ENCOUNTER — Encounter (HOSPITAL_COMMUNITY)
Admission: RE | Admit: 2012-10-07 | Discharge: 2012-10-07 | Disposition: A | Discharge status: Home / Self Care | Payer: BC Managed Care – PPO | Source: Ambulatory Visit | Attending: Obstetrics and Gynecology | Admitting: Obstetrics and Gynecology

## 2012-10-07 DIAGNOSIS — O923 Agalactia: Secondary | ICD-10-CM | POA: Insufficient documentation

## 2012-11-07 ENCOUNTER — Encounter (HOSPITAL_COMMUNITY)
Admission: RE | Admit: 2012-11-07 | Discharge: 2012-11-07 | Disposition: A | Discharge status: Home / Self Care | Payer: BC Managed Care – PPO | Source: Ambulatory Visit | Attending: Obstetrics and Gynecology | Admitting: Obstetrics and Gynecology

## 2012-11-07 DIAGNOSIS — O923 Agalactia: Secondary | ICD-10-CM | POA: Insufficient documentation

## 2012-12-08 ENCOUNTER — Encounter (HOSPITAL_COMMUNITY)
Admission: RE | Admit: 2012-12-08 | Discharge: 2012-12-08 | Disposition: A | Discharge status: Home / Self Care | Payer: BC Managed Care – PPO | Source: Ambulatory Visit | Attending: Obstetrics and Gynecology | Admitting: Obstetrics and Gynecology

## 2012-12-08 DIAGNOSIS — O923 Agalactia: Secondary | ICD-10-CM | POA: Insufficient documentation

## 2013-01-13 IMAGING — US US OB COMP +14 WK
1 of 2 series · 12 of 28 positions shown · non-contrast
Comparison: none

[Series 1: us ob comp +14 wk · 12 of 49 slices shown]
[im 1/49]
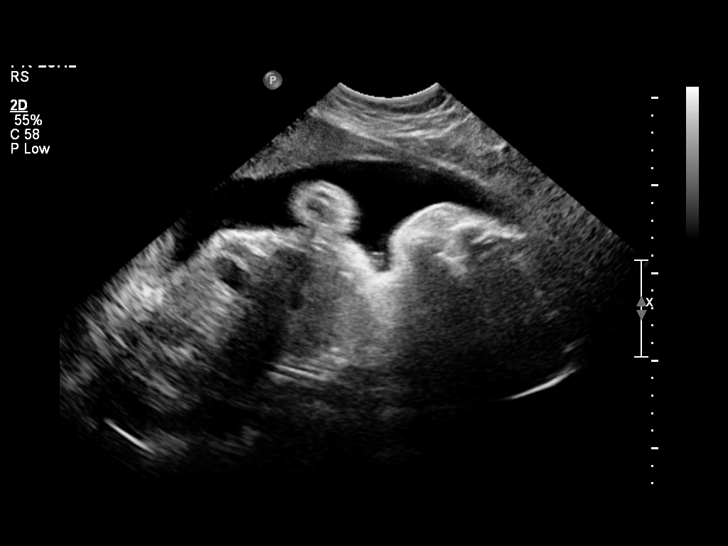
[im 4/49]
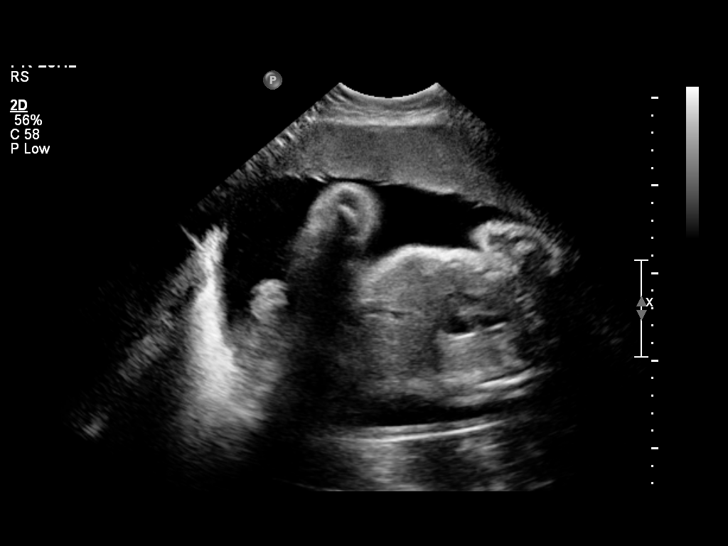
[im 8/49]
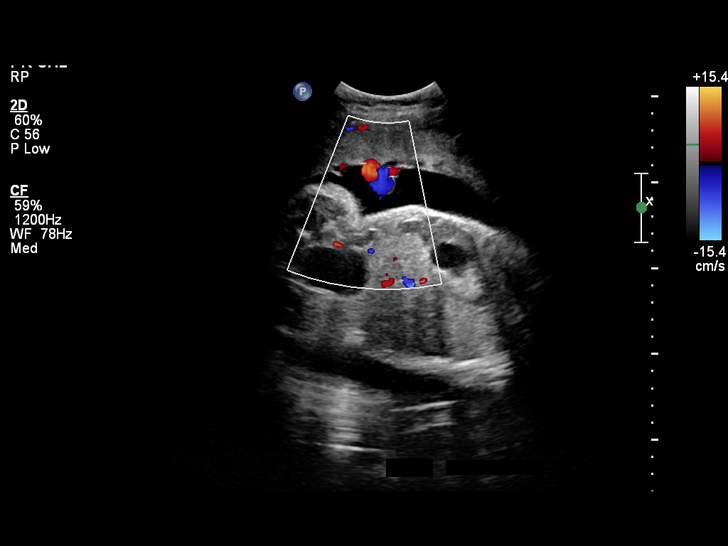
[im 13/49]
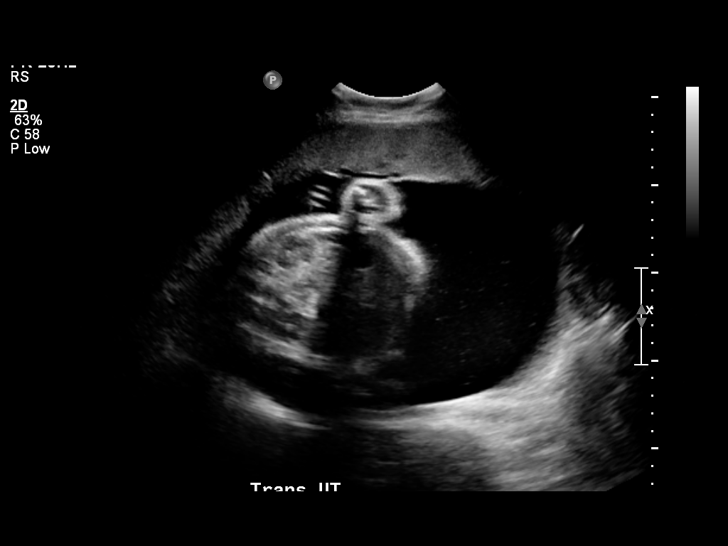
[im 17/49]
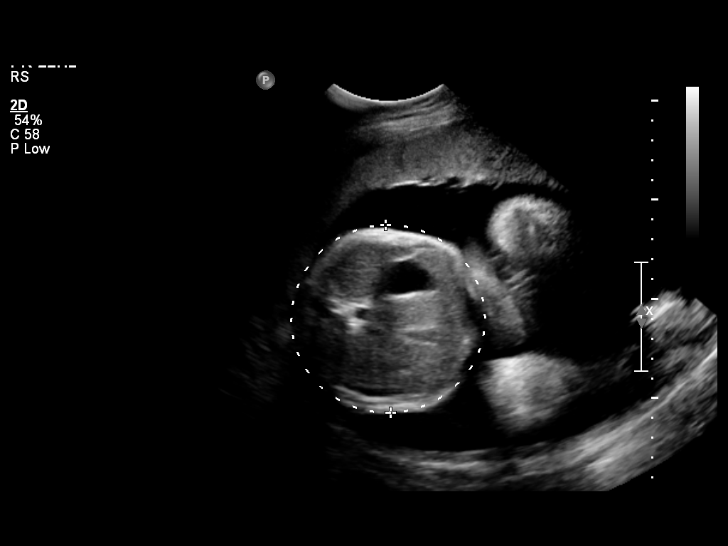
[im 21/49]
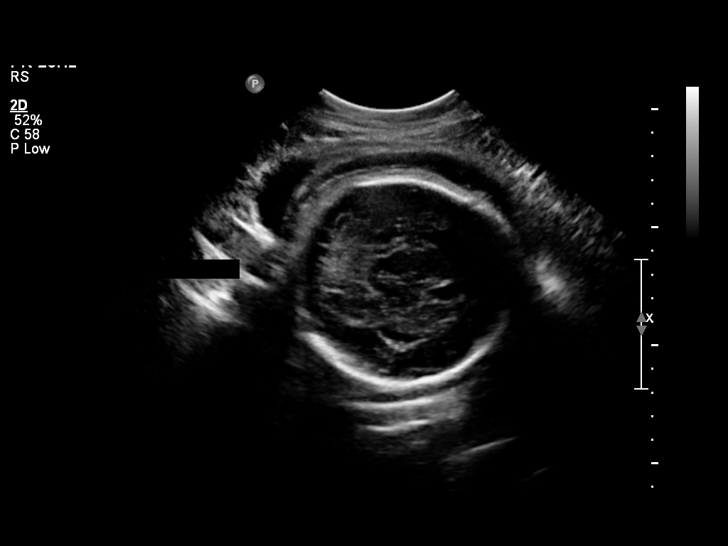
[im 26/49]
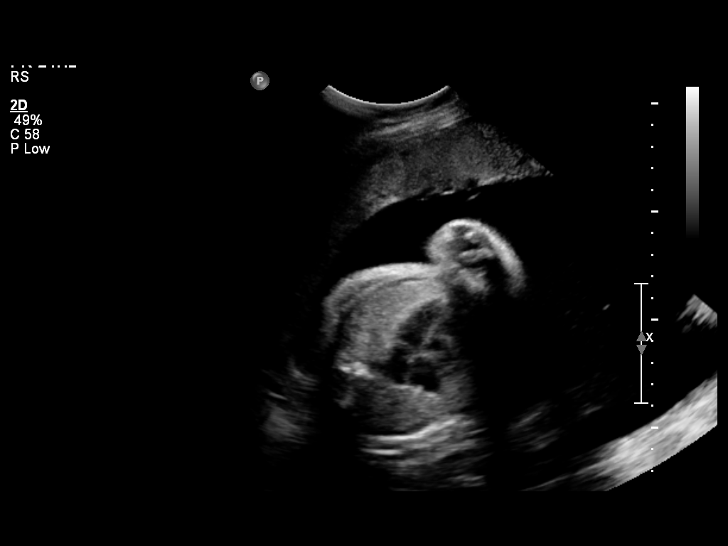
[im 30/49]
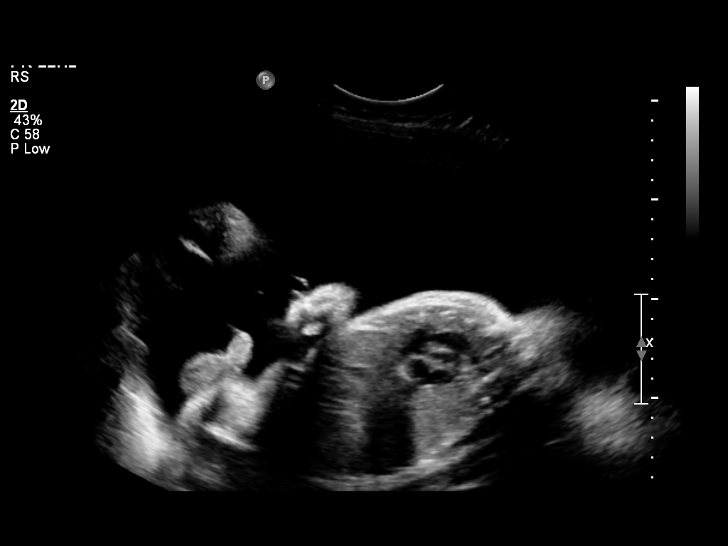
[im 34/49]
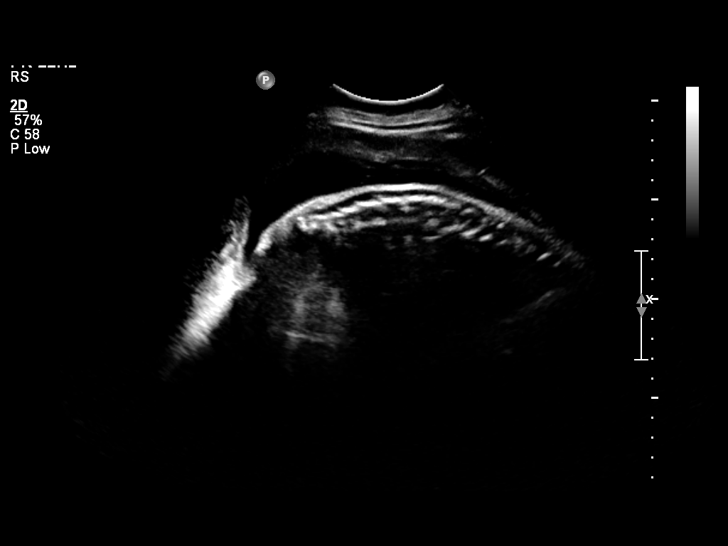
[im 39/49]
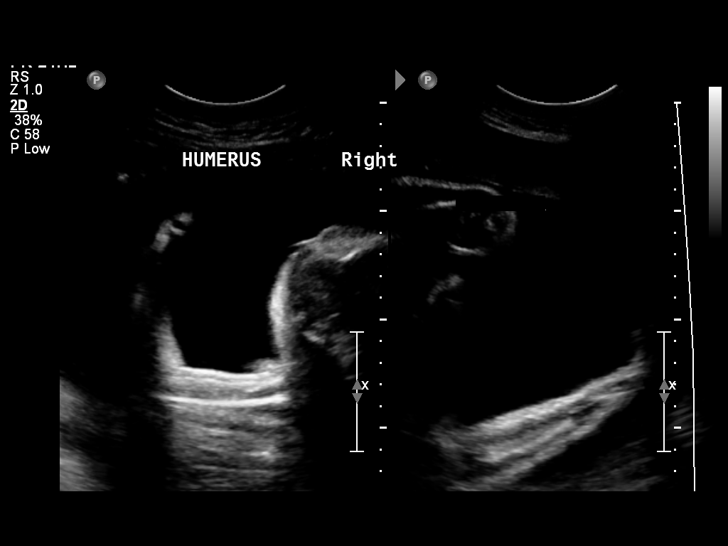
[im 43/49]
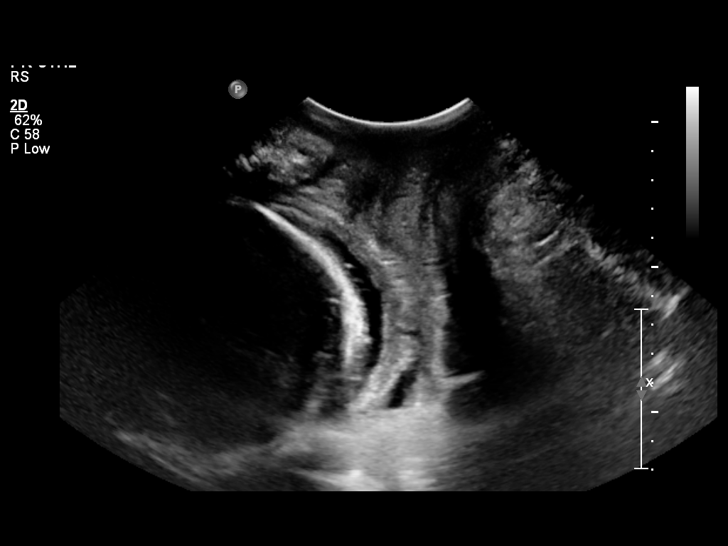
[im 47/49]
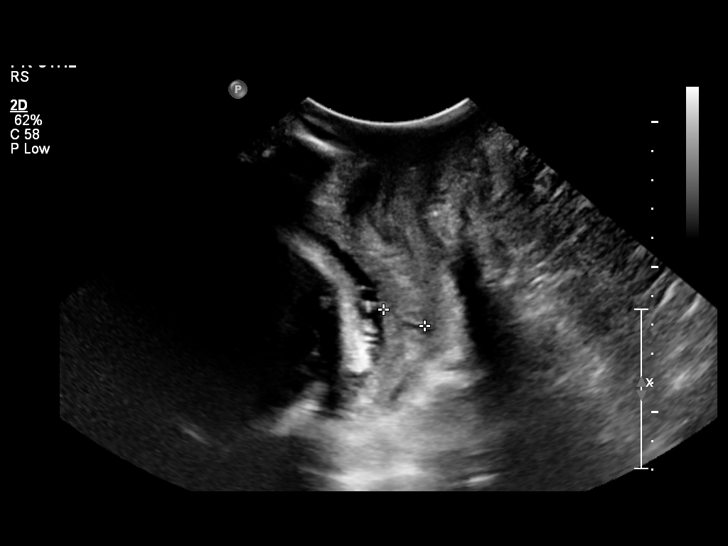

[12 of 28 positions shown; findings below may reference images not displayed]

OBSTETRICS REPORT
                      (Signed Final 07/27/2011 [DATE])

Procedures

 US OB COMP + 14 WK                                    76805.1
Indications

 Premature rupture of membranes - leaking fluid
 Assess amniotic fluid volume
 Assess Fetal Growth / Estimated Fetal Weight
 Assess presentaion
Fetal Evaluation

 Fetal Heart Rate:  146                          bpm
 Cardiac Activity:  Observed
 Presentation:      Cephalic
 Placenta:          Anterior, above cervical os
 P. Cord            Visualized
 Insertion:

 Amniotic Fluid
 AFI FV:      Subjectively increased
 AFI Sum:     26.78   cm       96  %Tile     Larg Pckt:    7.52  cm
 RUQ:   7.22    cm   RLQ:    7.52   cm    LUQ:   6.29    cm   LLQ:    5.75   cm
Biometry

 BPD:     85.9  mm     G. Age:  34w 5d                CI:        79.41   70 - 86
                                                      FL/HC:      21.6   19.9 -

 HC:     304.7  mm     G. Age:  33w 6d       29  %    HC/AC:      1.01   0.96 -

 AC:     300.6  mm     G. Age:  34w 0d       73  %    FL/BPD:     76.5   71 - 87
 FL:      65.7  mm     G. Age:  33w 6d       54  %    FL/AC:      21.9   20 - 24
 HUM:     58.9  mm     G. Age:  34w 0d       76  %

 Est. FW:    2886  gm      5 lb 2 oz     74  %
Gestational Age

 Clinical EDD:  33w 2d                                        EDD:   09/12/11
 U/S Today:     34w 1d                                        EDD:   09/06/11
 Best:          33w 2d     Det. By:  Clinical EDD             EDD:   09/12/11
Anatomy

 Cranium:           Appears normal      Aortic Arch:       Not well
                                                           visualized
 Fetal Cavum:       Appears normal      Ductal Arch:       Appears normal
 Ventricles:        Appears normal      Diaphragm:         Appears normal
 Choroid Plexus:    Appears normal      Stomach:           Appears
                                                           normal, left
                                                           sided
 Cerebellum:        Appears normal      Abdomen:           Appears normal
 Posterior Fossa:   Not well            Abdominal Wall:    Not well
                    visualized                             visualized
 Nuchal Fold:       Not applicable      Cord Vessels:      Appears normal
                    (>20 wks GA)                           (3 vessel cord)
 Face:              Lips appear         Kidneys:           Appear normal
                    normal
 Heart:             Appears normal      Bladder:           Appears normal
                    (4 chamber &
                    axis)
 RVOT:              Appears normal      Spine:             Appears normal
 LVOT:              Appears normal      Limbs:             Four extremities
                                                           seen

 Other:     Male gender. Technically difficult due to advanced GA
            and fetal position.  No signs of fetal hydrops visualized.
Cervix Uterus Adnexa

 Cervical Length:    1.5      cm

 Cervix:       Closed. Measured translabially.

 Adnexa:     No abnormality visualized.
Impression

 Assigned GA is currently 33w 2d by clinical EDD.
 Appropriate fetal growth, with EFW at 74 %ile.
 Amniotic fluid volume is subjectively increased, with AFI of
 26.78 cm (>95 %ile).
 No fetal anomalies seen involving visualized anatomy.  No
 signs of fetal hydrops.
 Shortened cervix, measuring 1.5cm in length.

## 2014-08-13 ENCOUNTER — Encounter (HOSPITAL_COMMUNITY): Payer: Self-pay | Admitting: *Deleted

## 2015-05-17 LAB — OB RESULTS CONSOLE HEPATITIS B SURFACE ANTIGEN: HEP B S AG: NEGATIVE

## 2015-05-17 LAB — OB RESULTS CONSOLE GC/CHLAMYDIA
CHLAMYDIA, DNA PROBE: NEGATIVE
GC PROBE AMP, GENITAL: NEGATIVE

## 2015-05-17 LAB — OB RESULTS CONSOLE RUBELLA ANTIBODY, IGM: Rubella: NON-IMMUNE/NOT IMMUNE

## 2015-05-17 LAB — OB RESULTS CONSOLE ABO/RH: RH Type: POSITIVE

## 2015-05-17 LAB — OB RESULTS CONSOLE ANTIBODY SCREEN: Antibody Screen: NEGATIVE

## 2015-05-17 LAB — OB RESULTS CONSOLE RPR: RPR: NONREACTIVE

## 2015-05-17 LAB — OB RESULTS CONSOLE HIV ANTIBODY (ROUTINE TESTING): HIV: NONREACTIVE

## 2015-10-13 NOTE — L&D Delivery Note (Addendum)
Delivery Note At 1:07 PM a viable female, "Kristine Mcclure", was delivered via Vaginal, Spontaneous Delivery (Presentation: Left Occiput Anterior).  APGAR: 8, 9; weight 8 lb 1 oz (3657 g).   Placenta status: Intact, Spontaneous.  Cord: 3 vessels with the following complications: None.  Cord pH: NA  Anesthesia: None  Episiotomy: None Lacerations: Right inner labial laceration--hemostatic and shallow, no repair required. Suture Repair: None Est. Blood Loss (mL): 100  Pitocin bolus given after delivery due to mild bogginess of uterus, but firm with massage.  Mom to postpartum.  Baby to Couplet care / Skin to Skin. Undecided regarding contraception.  Nigel BridgemanLATHAM, [name] 12/26/2015, 3:17 PM  Addendum: Called by Timoteo GaulMBU RN, [name]--uterus still slightly boggy, firms with massage, flow moderate. VSS. Pitocin infusion completed from L&D. Will start Methergine po course--0.2 mg po QID x 4 doses. Will continue to observe.  Nigel BridgemanVicki Shanessa Hodak, CNM 12/26/15 5p

## 2015-11-08 ENCOUNTER — Encounter (HOSPITAL_COMMUNITY): Payer: Self-pay | Admitting: *Deleted

## 2015-11-08 ENCOUNTER — Other Ambulatory Visit: Payer: Self-pay | Admitting: Obstetrics and Gynecology

## 2015-11-08 ENCOUNTER — Inpatient Hospital Stay (HOSPITAL_COMMUNITY)
Admission: AD | Admit: 2015-11-08 | Discharge: 2015-11-08 | Disposition: A | Discharge status: Home / Self Care | Payer: Self-pay | Source: Ambulatory Visit | Attending: Obstetrics & Gynecology | Admitting: Obstetrics & Gynecology

## 2015-11-08 DIAGNOSIS — Z8759 Personal history of other complications of pregnancy, childbirth and the puerperium: Secondary | ICD-10-CM

## 2015-11-08 DIAGNOSIS — Z8751 Personal history of pre-term labor: Secondary | ICD-10-CM

## 2015-11-08 DIAGNOSIS — O9989 Other specified diseases and conditions complicating pregnancy, childbirth and the puerperium: Secondary | ICD-10-CM

## 2015-11-08 DIAGNOSIS — O09213 Supervision of pregnancy with history of pre-term labor, third trimester: Secondary | ICD-10-CM | POA: Insufficient documentation

## 2015-11-08 DIAGNOSIS — Z2839 Other underimmunization status: Secondary | ICD-10-CM

## 2015-11-08 DIAGNOSIS — O26893 Other specified pregnancy related conditions, third trimester: Secondary | ICD-10-CM | POA: Insufficient documentation

## 2015-11-08 DIAGNOSIS — O09899 Supervision of other high risk pregnancies, unspecified trimester: Secondary | ICD-10-CM

## 2015-11-08 DIAGNOSIS — Z283 Underimmunization status: Secondary | ICD-10-CM

## 2015-11-08 DIAGNOSIS — Z3A34 34 weeks gestation of pregnancy: Secondary | ICD-10-CM | POA: Insufficient documentation

## 2015-11-08 HISTORY — DX: Personal history of other complications of pregnancy, childbirth and the puerperium: Z87.59

## 2015-11-08 HISTORY — DX: Other underimmunization status: Z28.39

## 2015-11-08 HISTORY — DX: Personal history of pre-term labor: Z87.51

## 2015-11-08 HISTORY — DX: Supervision of other high risk pregnancies, unspecified trimester: O09.899

## 2015-11-08 LAB — PROTEIN / CREATININE RATIO, URINE
CREATININE, URINE: 272 mg/dL
PROTEIN CREATININE RATIO: 0.14 mg/mg{creat} (ref 0.00–0.15)
Total Protein, Urine: 39 mg/dL

## 2015-11-08 LAB — CBC
HCT: 38.1 % (ref 36.0–46.0)
Hemoglobin: 13 g/dL (ref 12.0–15.0)
MCH: 30 pg (ref 26.0–34.0)
MCHC: 34.1 g/dL (ref 30.0–36.0)
MCV: 88 fL (ref 78.0–100.0)
PLATELETS: 247 10*3/uL (ref 150–400)
RBC: 4.33 MIL/uL (ref 3.87–5.11)
RDW: 13.6 % (ref 11.5–15.5)
WBC: 14.2 10*3/uL — AB (ref 4.0–10.5)

## 2015-11-08 LAB — COMPREHENSIVE METABOLIC PANEL
ALT: 51 U/L (ref 14–54)
AST: 33 U/L (ref 15–41)
Albumin: 3.4 g/dL — ABNORMAL LOW (ref 3.5–5.0)
Alkaline Phosphatase: 86 U/L (ref 38–126)
Anion gap: 9 (ref 5–15)
BUN: 9 mg/dL (ref 6–20)
CHLORIDE: 106 mmol/L (ref 101–111)
CO2: 21 mmol/L — AB (ref 22–32)
Calcium: 9 mg/dL (ref 8.9–10.3)
Creatinine, Ser: 0.63 mg/dL (ref 0.44–1.00)
Glucose, Bld: 108 mg/dL — ABNORMAL HIGH (ref 65–99)
POTASSIUM: 3.7 mmol/L (ref 3.5–5.1)
SODIUM: 136 mmol/L (ref 135–145)
Total Bilirubin: 0.4 mg/dL (ref 0.3–1.2)
Total Protein: 6.8 g/dL (ref 6.5–8.1)

## 2015-11-08 LAB — URIC ACID: URIC ACID, SERUM: 3.5 mg/dL (ref 2.3–6.6)

## 2015-11-08 LAB — URINALYSIS, ROUTINE W REFLEX MICROSCOPIC
BILIRUBIN URINE: NEGATIVE
Glucose, UA: NEGATIVE mg/dL
HGB URINE DIPSTICK: NEGATIVE
KETONES UR: 15 mg/dL — AB
Leukocytes, UA: NEGATIVE
NITRITE: NEGATIVE
Protein, ur: 30 mg/dL — AB
pH: 6 (ref 5.0–8.0)

## 2015-11-08 LAB — URINE MICROSCOPIC-ADD ON: RBC / HPF: NONE SEEN RBC/hpf (ref 0–5)

## 2015-11-08 LAB — LACTATE DEHYDROGENASE: LDH: 103 U/L (ref 98–192)

## 2015-11-08 MED ORDER — BETAMETHASONE SOD PHOS & ACET 6 (3-3) MG/ML IJ SUSP
12.0000 mg | INTRAMUSCULAR | Status: DC
  Administered 2015-11-08: 12 mg via INTRAMUSCULAR
  Filled 2015-11-08: qty 2

## 2015-11-08 NOTE — Discharge Instructions (Signed)

## 2015-11-08 NOTE — MAU Note (Signed)
Pt was notified by [facility] her lab results from yesterday indicated she needed further evaluation.  Pt denies headaches, epigastric pain, or visual changes.  Good fetal movement.  Denies vaginal bleeding, ROM, or abnormal discharge.

## 2015-11-08 NOTE — MAU Provider Note (Signed)
History   35 yo G2P0101 at 34 2/7 weeks presented as directed by office due to elevated AST (33), ALT (50), and PCR 0.359 on yesterday's labs.  BP at office yesterday was 122/78.  Denies HA, visual sx, or epigastric pain.  Here for betamethasone, serial BPs, and repeat PIH labs per consult with Dr. Sallye Ober.  Hx PPROM at 33 weeks with last pregnancy, with subsequent dx of severe pre-eclampsia.  Admitted 07/27/11 with PROM, with development of severe pre-eclampsia on 07/31/11. Induction initiated, and patient delivered vaginally after pit induction.  She was on magnesium sulfate during labor and for 24 hours pp. Has declined 17P this pregnancy.  Patient Active Problem List   Diagnosis Date Noted  . Rubella non-immune status, antepartum 11/08/2015  . History of preterm delivery--PPROM at 33 weeks, pre-eclampsia 11/08/2015  . H/O severe pre-eclampsia 11/08/2015     HPI:  As above  OB History    Gravida Para Term Preterm AB TAB SAB Ectopic Multiple Living   Past Medical History  Diagnosis Date  . No pertinent past medical history   . Preterm labor     Past Surgical History  Procedure Laterality Date  . No past surgeries      History reviewed. No pertinent family history.  Social History  Substance Use Topics  . Smoking status: Never Smoker   . Smokeless tobacco: None  . Alcohol Use: No    Allergies: No Known Allergies  No prescriptions prior to admission    ROS Physical Exam   Blood pressure 104/69, pulse 83, temperature 97.8 F (36.6 C), temperature source Oral, resp. rate 18, SpO2 100 %, unknown if currently breastfeeding.  Filed Vitals:   11/08/15 1130 11/08/15 1145 11/08/15 1200 11/08/15 1215  BP: 96/64 114/66 99/66 104/69  Pulse: 94 92 88 83  Temp:      TempSrc:      Resp:    18  SpO2:        Physical Exam  In NAD Chest clear Heart RRR without murmur Abd gravid, NT Pelvic--cervix posterior, closed, 50%, vtx, -2, cervix firm Ext  DTR 2+, no clonus, trace edema  FHR Category 1 UCs--mild irritability  Results for orders placed or performed during the hospital encounter of 11/08/15 (from the past 24 hour(s))  Urinalysis, Routine w reflex microscopic (not at [facility])     Status: Abnormal   Collection Time: 11/08/15 10:40 AM  Result Value Ref Range   Color, Urine YELLOW YELLOW   APPearance CLEAR CLEAR   Specific Gravity, Urine >1.030 (H) 1.005 - 1.030   pH 6.0 5.0 - 8.0   Glucose, UA NEGATIVE NEGATIVE mg/dL   Hgb urine dipstick NEGATIVE NEGATIVE   Bilirubin Urine NEGATIVE NEGATIVE   Ketones, ur 15 (A) NEGATIVE mg/dL   Protein, ur 30 (A) NEGATIVE mg/dL   Nitrite NEGATIVE NEGATIVE   Leukocytes, UA NEGATIVE NEGATIVE  Protein / creatinine ratio, urine     Status: None   Collection Time: 11/08/15 10:40 AM  Result Value Ref Range   Creatinine, Urine 272.00 mg/dL   Total Protein, Urine 39 mg/dL   Protein Creatinine Ratio 0.14 0.00 - 0.15 mg/mg[Cre]  Urine microscopic-add on     Status: Abnormal   Collection Time: 11/08/15 10:40 AM  Result Value Ref Range   Squamous Epithelial / LPF 0-5 (A) NONE SEEN   WBC, UA 0-5 0 - 5 WBC/hpf   RBC /  HPF NONE SEEN 0 - 5 RBC/hpf   Bacteria, UA MANY (A) NONE SEEN   Crystals CA OXALATE CRYSTALS (A) NEGATIVE   Urine-Other MUCOUS PRESENT   CBC     Status: Abnormal   Collection Time: 11/08/15 11:17 AM  Result Value Ref Range   WBC 14.2 (H) 4.0 - 10.5 K/uL   RBC 4.33 3.87 - 5.11 MIL/uL   Hemoglobin 13.0 12.0 - 15.0 g/dL   HCT 91.4 78.2 - 95.6 %   MCV 88.0 78.0 - 100.0 fL   MCH 30.0 26.0 - 34.0 pg   MCHC 34.1 30.0 - 36.0 g/dL   RDW 21.3 08.6 - 57.8 %   Platelets 247 150 - 400 K/uL  Comprehensive metabolic panel     Status: Abnormal   Collection Time: 11/08/15 11:17 AM  Result Value Ref Range   Sodium 136 135 - 145 mmol/L   Potassium 3.7 3.5 - 5.1 mmol/L   Chloride 106 101 - 111 mmol/L   CO2 21 (L) 22 - 32 mmol/L   Glucose, Bld 108 (H) 65 - 99 mg/dL   BUN 9 6 - 20 mg/dL    Creatinine, Ser 4.69 0.44 - 1.00 mg/dL   Calcium 9.0 8.9 - 62.9 mg/dL   Total Protein 6.8 6.5 - 8.1 g/dL   Albumin 3.4 (L) 3.5 - 5.0 g/dL   AST 33 15 - 41 U/L   ALT 51 14 - 54 U/L   Alkaline Phosphatase 86 38 - 126 U/L   Total Bilirubin 0.4 0.3 - 1.2 mg/dL   GFR calc non Af Amer >60 >60 mL/min   GFR calc Af Amer >60 >60 mL/min   Anion gap 9 5 - 15  Lactate dehydrogenase     Status: None   Collection Time: 11/08/15 11:17 AM  Result Value Ref Range   LDH 103 98 - 192 U/L  Uric acid     Status: None   Collection Time: 11/08/15 11:17 AM  Result Value Ref Range   Uric Acid, Serum 3.5 2.3 - 6.6 mg/dL   Received 1st dose betamethasone at 1139  ED Course  Assessment: IUP at 34 2/7 weeks Hx pre-eclampsia  Hx PROM at 33 weeks No evidence of gestational hypertension or pre-eclampsia  Plan: D/C home per consult with Dr. Sallye Ober [facility] precautions reviewed Complete betamethasone course tomorrow in MAU. ROB visits twice weekly, with BPP/AFI once per week. Patient has ROB visit and [place] already scheduled for Tuesday, 11/12/15---office staff will add BPP to that visit. Will need ROB visit on 11/15/15.  [name] CNM, MSN 11/08/2015 12:45 PM

## 2015-11-09 ENCOUNTER — Inpatient Hospital Stay (HOSPITAL_COMMUNITY)
Admission: AD | Admit: 2015-11-09 | Discharge: 2015-11-09 | Disposition: A | Discharge status: Home / Self Care | Payer: Self-pay | Source: Ambulatory Visit | Attending: Obstetrics & Gynecology | Admitting: Obstetrics & Gynecology

## 2015-11-09 DIAGNOSIS — Z3A34 34 weeks gestation of pregnancy: Secondary | ICD-10-CM | POA: Insufficient documentation

## 2015-11-09 LAB — CULTURE, OB URINE

## 2015-11-09 MED ORDER — BETAMETHASONE SOD PHOS & ACET 6 (3-3) MG/ML IJ SUSP
12.0000 mg | Freq: Once | INTRAMUSCULAR | Status: AC
  Administered 2015-11-09: 12 mg via INTRAMUSCULAR
  Filled 2015-11-09: qty 2

## 2015-11-09 NOTE — MAU Note (Signed)
Patient here for 2nd betamethasone injection.

## 2015-12-23 ENCOUNTER — Telehealth (HOSPITAL_COMMUNITY): Payer: Self-pay | Admitting: *Deleted

## 2015-12-23 ENCOUNTER — Encounter (HOSPITAL_COMMUNITY): Payer: Self-pay | Admitting: *Deleted

## 2015-12-23 LAB — OB RESULTS CONSOLE GBS: STREP GROUP B AG: NEGATIVE

## 2015-12-23 NOTE — Telephone Encounter (Signed)
Preadmission screen  

## 2015-12-25 ENCOUNTER — Inpatient Hospital Stay (HOSPITAL_COMMUNITY): Admission: RE | Admit: 2015-12-25 | Payer: Self-pay | Source: Ambulatory Visit

## 2015-12-26 ENCOUNTER — Encounter (HOSPITAL_COMMUNITY): Payer: Self-pay | Admitting: Obstetrics and Gynecology

## 2015-12-26 ENCOUNTER — Inpatient Hospital Stay (HOSPITAL_COMMUNITY)
Admission: AD | Admit: 2015-12-26 | Discharge: 2015-12-27 | DRG: 775 | Disposition: A | Discharge status: Home / Self Care | Payer: BLUE CROSS/BLUE SHIELD | Source: Ambulatory Visit | Attending: Obstetrics and Gynecology | Admitting: Obstetrics and Gynecology

## 2015-12-26 DIAGNOSIS — Z833 Family history of diabetes mellitus: Secondary | ICD-10-CM

## 2015-12-26 DIAGNOSIS — Z3A41 41 weeks gestation of pregnancy: Secondary | ICD-10-CM | POA: Diagnosis not present

## 2015-12-26 DIAGNOSIS — O403XX0 Polyhydramnios, third trimester, not applicable or unspecified: Principal | ICD-10-CM | POA: Diagnosis not present

## 2015-12-26 DIAGNOSIS — O9081 Anemia of the puerperium: Secondary | ICD-10-CM | POA: Diagnosis not present

## 2015-12-26 DIAGNOSIS — D649 Anemia, unspecified: Secondary | ICD-10-CM | POA: Diagnosis not present

## 2015-12-26 DIAGNOSIS — O480 Post-term pregnancy: Secondary | ICD-10-CM | POA: Diagnosis present

## 2015-12-26 LAB — CBC
HEMATOCRIT: 42.1 % (ref 36.0–46.0)
Hemoglobin: 14.9 g/dL (ref 12.0–15.0)
MCH: 30.4 pg (ref 26.0–34.0)
MCHC: 35.4 g/dL (ref 30.0–36.0)
MCV: 85.9 fL (ref 78.0–100.0)
Platelets: 265 10*3/uL (ref 150–400)
RBC: 4.9 MIL/uL (ref 3.87–5.11)
RDW: 14.1 % (ref 11.5–15.5)
WBC: 19.1 10*3/uL — AB (ref 4.0–10.5)

## 2015-12-26 LAB — TYPE AND SCREEN
ABO/RH(D): A POS
Antibody Screen: NEGATIVE

## 2015-12-26 LAB — ABO/RH: ABO/RH(D): A POS

## 2015-12-26 MED ORDER — OXYCODONE-ACETAMINOPHEN 5-325 MG PO TABS: 2.0000 | ORAL_TABLET | ORAL | Status: DC | PRN

## 2015-12-26 MED ORDER — OXYTOCIN BOLUS FROM INFUSION: 500.0000 mL | INTRAVENOUS | Status: DC

## 2015-12-26 MED ORDER — ZOLPIDEM TARTRATE 5 MG PO TABS: 5.0000 mg | ORAL_TABLET | Freq: Every evening | ORAL | Status: DC | PRN

## 2015-12-26 MED ORDER — TETANUS-DIPHTH-ACELL PERTUSSIS 5-2.5-18.5 LF-MCG/0.5 IM SUSP: 0.5000 mL | Freq: Once | INTRAMUSCULAR | Status: DC

## 2015-12-26 MED ORDER — INFLUENZA VAC SPLIT QUAD 0.5 ML IM SUSY
0.5000 mL | PREFILLED_SYRINGE | INTRAMUSCULAR | Status: DC
Start: 2015-12-27 — End: 2015-12-27

## 2015-12-26 MED ORDER — OXYCODONE HCL 5 MG PO TABS: 5.0000 mg | ORAL_TABLET | ORAL | Status: DC | PRN

## 2015-12-26 MED ORDER — SIMETHICONE 80 MG PO CHEW: 80.0000 mg | CHEWABLE_TABLET | ORAL | Status: DC | PRN

## 2015-12-26 MED ORDER — OXYCODONE HCL 5 MG PO TABS: 10.0000 mg | ORAL_TABLET | ORAL | Status: DC | PRN

## 2015-12-26 MED ORDER — SODIUM CHLORIDE 0.9% FLUSH: 3.0000 mL | INTRAVENOUS | Status: DC | PRN

## 2015-12-26 MED ORDER — SODIUM CHLORIDE 0.9 % IV SOLN: 250.0000 mL | INTRAVENOUS | Status: DC | PRN

## 2015-12-26 MED ORDER — WITCH HAZEL-GLYCERIN EX PADS
1.0000 "application " | MEDICATED_PAD | CUTANEOUS | Status: DC | PRN
  Administered 2015-12-26: 1 via TOPICAL

## 2015-12-26 MED ORDER — PRENATAL MULTIVITAMIN CH
1.0000 | ORAL_TABLET | Freq: Every day | ORAL | Status: DC
  Administered 2015-12-27: 1 via ORAL
  Filled 2015-12-26: qty 1

## 2015-12-26 MED ORDER — FENTANYL CITRATE (PF) 100 MCG/2ML IJ SOLN: 100.0000 ug | INTRAMUSCULAR | Status: DC | PRN

## 2015-12-26 MED ORDER — LACTATED RINGERS IV SOLN: INTRAVENOUS | Status: DC

## 2015-12-26 MED ORDER — OXYCODONE-ACETAMINOPHEN 5-325 MG PO TABS: 1.0000 | ORAL_TABLET | ORAL | Status: DC | PRN

## 2015-12-26 MED ORDER — SODIUM CHLORIDE 0.9% FLUSH: 3.0000 mL | Freq: Two times a day (BID) | INTRAVENOUS | Status: DC

## 2015-12-26 MED ORDER — FLEET ENEMA 7-19 GM/118ML RE ENEM: 1.0000 | ENEMA | RECTAL | Status: DC | PRN

## 2015-12-26 MED ORDER — IBUPROFEN 600 MG PO TABS
600.0000 mg | ORAL_TABLET | Freq: Four times a day (QID) | ORAL | Status: DC
  Administered 2015-12-26 – 2015-12-27 (×4): 600 mg via ORAL
  Filled 2015-12-26 (×4): qty 1

## 2015-12-26 MED ORDER — ONDANSETRON HCL 4 MG/2ML IJ SOLN: 4.0000 mg | INTRAMUSCULAR | Status: DC | PRN

## 2015-12-26 MED ORDER — ACETAMINOPHEN 325 MG PO TABS: 650.0000 mg | ORAL_TABLET | ORAL | Status: DC | PRN

## 2015-12-26 MED ORDER — BENZOCAINE-MENTHOL 20-0.5 % EX AERO
1.0000 "application " | INHALATION_SPRAY | CUTANEOUS | Status: DC | PRN
  Filled 2015-12-26: qty 56

## 2015-12-26 MED ORDER — LIDOCAINE HCL (PF) 1 % IJ SOLN
30.0000 mL | INTRAMUSCULAR | Status: DC | PRN
  Filled 2015-12-26: qty 30

## 2015-12-26 MED ORDER — METHYLERGONOVINE MALEATE 0.2 MG PO TABS
0.2000 mg | ORAL_TABLET | Freq: Four times a day (QID) | ORAL | Status: DC
Start: 2015-12-26 — End: 2015-12-27
  Administered 2015-12-26 – 2015-12-27 (×3): 0.2 mg via ORAL
  Filled 2015-12-26 (×3): qty 1

## 2015-12-26 MED ORDER — DIBUCAINE 1 % RE OINT
1.0000 "application " | TOPICAL_OINTMENT | RECTAL | Status: DC | PRN
  Administered 2015-12-26: 1 via RECTAL
  Filled 2015-12-26: qty 28

## 2015-12-26 MED ORDER — CITRIC ACID-SODIUM CITRATE 334-500 MG/5ML PO SOLN: 30.0000 mL | ORAL | Status: DC | PRN

## 2015-12-26 MED ORDER — LACTATED RINGERS IV SOLN
500.0000 mL | INTRAVENOUS | Status: DC | PRN
  Administered 2015-12-26: 500 mL via INTRAVENOUS

## 2015-12-26 MED ORDER — ONDANSETRON HCL 4 MG PO TABS: 4.0000 mg | ORAL_TABLET | ORAL | Status: DC | PRN

## 2015-12-26 MED ORDER — DIPHENHYDRAMINE HCL 25 MG PO CAPS: 25.0000 mg | ORAL_CAPSULE | Freq: Four times a day (QID) | ORAL | Status: DC | PRN

## 2015-12-26 MED ORDER — LANOLIN HYDROUS EX OINT: TOPICAL_OINTMENT | CUTANEOUS | Status: DC | PRN

## 2015-12-26 MED ORDER — SENNOSIDES-DOCUSATE SODIUM 8.6-50 MG PO TABS
2.0000 | ORAL_TABLET | ORAL | Status: DC
  Administered 2015-12-26: 2 via ORAL
  Filled 2015-12-26: qty 2

## 2015-12-26 MED ORDER — LACTATED RINGERS IV SOLN
2.5000 [IU]/h | INTRAVENOUS | Status: DC
  Filled 2015-12-26: qty 10

## 2015-12-26 MED ORDER — ONDANSETRON HCL 4 MG/2ML IJ SOLN: 4.0000 mg | Freq: Four times a day (QID) | INTRAMUSCULAR | Status: DC | PRN

## 2015-12-26 NOTE — Progress Notes (Signed)
  Subjective: Feeling more pressure, crying with UCs.  Doula and husband at bedside.  Objective: BP 119/81 mmHg  Pulse 95  Temp(Src) 97.5 F (36.4 C) (Oral)  Resp 18  Ht 5\' 1"  (1.549 m)  Wt 86.637 kg (191 lb)  BMI 36.11 kg/m2      FHT: Category 2--baseline 105-115, accels with scalp stim, moderate variability UC:   regular, every 3 minutes SVE:   Dilation: Lip/rim Effacement (%): 100 Station: 0 Exam by:: D Herr rn--by VL at 1200 No descent with trial push, no reduction in anterior lip.  Assessment:  Persistent anterior lip  Plan: Position to facilitate rotation/descent. Await increased urge to push.  Nigel BridgemanLATHAM, Jenise Iannelli CNM 12/26/2015, 12:14 PM

## 2015-12-26 NOTE — H&P (Signed)
Kristine Mcclure is a 35 y.o. female, G2P1001 at 52 1/7 weeks, presenting for UCs of increasing frequency and intensity since early am.  Denies leaking, has had bloody mucus, notes +FM.    Patient Active Problem List   Diagnosis Date Noted  . Normal labor 12/26/2015  . Polyhydramnios in third trimester 12/26/2015  . Rubella non-immune status, antepartum 11/08/2015  . History of preterm delivery--PPROM at 33 weeks, pre-eclampsia 11/08/2015  . H/O severe pre-eclampsia 11/08/2015    History of present pregnancy: Patient entered care at 10 2/7 weeks.   EDC of 12/18/15 was established by LMP   Anatomy scan:  19 1/7 weeks, with normal findings and a anterior placenta.  Cervical length was 2.96-3.51 and dynamic. Additional [place] evaluations:  23 1/7 weeks:  Cervix 4.12, no funneling or dynamic changes.   34 6/7 weeks:  EFW 2740 gm, 77%ile, AFI 25.9 cm, polyhydramnios, vtx, BPP 8/8. 35 6/7 weeks:  AFI 32.89, >97%ile, BPP 8/8, vtx. 36 6/7 weeks:  AFI 21, 80%ile, vtx, BPP 8/8 37 5/7 weeks:  EFW 3412 gm, 7+8, 71%ile, AFI 22.60, 85%ile, vtx, BPP 8/8 38 5/7 weeks: EFW 8+1, 87%ile, vtx, AFI 20.74, 80%ile, BPP 8/8 40 1/7 weeks:  AFI 25.45, > 97%ile, vtx, BPP 8/8  Significant prenatal events:  Declined flu and TDAP.  Wanted waterbirth, but polyhydramnios precluded that due to risk. Declined 17P.  No evidence of elevated BP this pregnancy.  Followed for polyhydramnios since 34 weeks with weekly BPPs and f/u fluid evaluations.  Had declined induction until 12/28/15. Last evaluation:  12/25/15--cervix closed, posterior.  BP 130/82.  OB History    Gravida Para Term Preterm AB TAB SAB Ectopic Multiple Living   2012--SVB, 33 weeks, PROM, dx with severe pre-eclampsia, induced, 12 hour labor, no pain med  Past Medical History  Diagnosis Date  . Preterm labor   . Preterm delivery without spontaneous labor 08/02/2011   Past Surgical History  Procedure Laterality Date  . No past surgeries     . Wisdom tooth extraction     Family History: MI MGM, MGF, PGF; DM MGF, PGF; Rheumatoid arthritis PGM  Social History:  reports that she has never smoked. She does not have any smokeless tobacco history on file. She reports that she does not drink alcohol or use illicit drugs.  Patient is Caucasian, post grad educated, SAHM, married to [place], who is involved and supportive.   Prenatal Transfer Tool  Maternal Diabetes: No Genetic Screening: Declined Maternal Ultrasounds/Referrals: Abnormal:  Findings:   Other:Polyhydramnios Fetal Ultrasounds or other Referrals:  None Maternal Substance Abuse:  No Significant Maternal Medications:  None Significant Maternal Lab Results: Lab values include: Group B Strep negative  TDAP Declined Flu Declined  ROS:  UCs, bloody mucus, +FM  No Known Allergies   Dilation: 7 Effacement (%): 90 Station: -1 Exam by:: Zakarie Sturdivant cnm unknown if currently breastfeeding.  Chest clear Heart RRR without murmur Abd gravid, NT, FH 42 cm Pelvic: As above, BBOW Ext: WNL  FHR: Category 1 UCs:  q 3-5 min, moderate  Prenatal labs: ABO, Rh: A/Positive/-- (08/05 0000) Antibody: Negative (08/05 0000) Rubella:  Non-immune RPR: Nonreactive (08/05 0000)  HBsAg: Negative (08/05 0000)  HIV: Non-reactive (08/05 0000)  GBS: Negative (03/13 0000) Sickle cell/Hgb electrophoresis:  NA Pap:  05/24/15 WNL GC:  Negative 05/17/15 Chlamydia:  Negative 8/5/`6 Genetic screenings:  Declined Glucola:  Elevated 1 hour, single elevated value on  3 hour GTT Other:   Hgb 13.6 at NOB, 13 at 28 weeks PIH labs and PCR WNL 10/29/15       Assessment/Plan: IUP at 41 1/4 weeks Active labor GBS negative Polyhydramnios Hx PTD Hx severe pre-eclampsia with 1st pregnancy Rubella non-immune  [facility] Dr. Normand Sloopillard Routine [facility] orders Pain med/epidural prn Continuous EFM Saline lock  Roshini Fulwider, VICKICNM, [place] 12/26/2015, 8:42 AM

## 2015-12-26 NOTE — Progress Notes (Signed)
  Subjective: Standing at bedside--SROM at 1042, clear fluid, occasional urge to push, but not persistent.  Objective: BP 119/81 mmHg  Pulse 95  Temp(Src) 97.5 F (36.4 C) (Oral)  Resp 18  Ht 5\' 1"  (1.549 m)  Wt 86.637 kg (191 lb)  BMI 36.11 kg/m2      FHT: Category 2--early decels with UCs, moderate variability UC:   irregular, every 2-4 minutes, moderate SVE:   Dilation: 9 Effacement (%): 100 Station: 0 Exam by:: D Herr rn--at 1048  VE by me---anterior rim of cx, vtx, -1/0, ? Posterior.   Assessment:  Transitional labor GBS negative  Plan: Continue observation, await increased urge to push.  Nigel BridgemanLATHAM, Erlene Devita CNM 12/26/2015, 11:12 AM

## 2015-12-26 NOTE — Lactation Note (Signed)
This note was copied from a baby's chart. Lactation Consultation Note  Patient Name: Kristine Alycia PattenKimberly Reisner ZOXWR'UToday's Date: 12/26/2015 Reason for consult: Initial assessment Baby at 5 hr oflife and mom reports bf is going well. She has been bf her 694 yr old son "some" during the pregnancy. Discussed tandem nursing. She might want to rent the DEBP from the hospital. She stated that she can manually express, has seen colostrum bilaterally, has a spoon. Discussed baby behavior, feeding frequency, baby belly size, voids, wt loss, breast changes, and nipple care. Given lactation handouts. Aware of OP services and support group.    Maternal Data Has patient been taught Hand Expression?: Yes Does the patient have breastfeeding experience prior to this delivery?: Yes  Feeding    LATCH Score/Interventions                      Lactation Tools Discussed/Used WIC Program: No   Consult Status Consult Status: Follow-up Date: 12/27/15 Follow-up type: In-patient    Rulon Eisenmengerlizabeth E Trinia Georgi 12/26/2015, 6:10 PM

## 2015-12-26 NOTE — Progress Notes (Signed)
  Subjective: Standing at bedside, rocking.  Husband and doula, Kristine Mcclure, at bedside.  Objective: BP 119/81 mmHg  Pulse 95  Temp(Src) 97.5 F (36.4 C) (Oral)  Resp 18  Ht 5\' 1"  (1.549 m)  Wt 86.637 kg (191 lb)  BMI 36.11 kg/m2      FHT: Category 1 UC:   irregular, every 3-5 minutes SVE:   Dilation: 7 Effacement (%): 90 Station: -1 Exam by:: Kristine Mcclure cnm on admission   Assessment:  Active labor Polyhydramnios  Plan: Continue current care. Patient declined AROM at present.  Nigel BridgemanLATHAM, Aurelio Mccamy CNM 12/26/2015, 9:16 AM

## 2015-12-26 NOTE — Progress Notes (Signed)
CRNA in room, explaining epidural

## 2015-12-27 LAB — CBC
HCT: 34.5 % — ABNORMAL LOW (ref 36.0–46.0)
HEMOGLOBIN: 11.7 g/dL — AB (ref 12.0–15.0)
MCH: 29.4 pg (ref 26.0–34.0)
MCHC: 33.9 g/dL (ref 30.0–36.0)
MCV: 86.7 fL (ref 78.0–100.0)
PLATELETS: 223 10*3/uL (ref 150–400)
RBC: 3.98 MIL/uL (ref 3.87–5.11)
RDW: 14.4 % (ref 11.5–15.5)
WBC: 20.5 10*3/uL — ABNORMAL HIGH (ref 4.0–10.5)

## 2015-12-27 LAB — RPR: RPR: NONREACTIVE

## 2015-12-27 MED ORDER — MEASLES, MUMPS & RUBELLA VAC ~~LOC~~ INJ
0.5000 mL | INJECTION | Freq: Once | SUBCUTANEOUS | Status: DC
  Filled 2015-12-27: qty 0.5

## 2015-12-27 MED ORDER — ACETAMINOPHEN 325 MG PO TABS: 650.0000 mg | ORAL_TABLET | ORAL | Status: DC | PRN

## 2015-12-27 MED ORDER — IBUPROFEN 600 MG PO TABS: 600.0000 mg | ORAL_TABLET | Freq: Four times a day (QID) | ORAL | Status: DC

## 2015-12-27 NOTE — Lactation Note (Signed)
This note was copied from a baby's chart. Lactation Consultation Note  Patient Name: Girl Kristine PattenKimberly Merrihew YNWGN'FToday's Date: 12/27/2015   Visited with Mom on day of discharge, baby 8223 hrs old.  Mom states baby has been latching well, no problem.  Right nipple has very small blister on tip after baby comes off the breast.  Talked about making sure baby gets deeply latched to the breast.  Recommended she express her breast milk onto nipple after feedings.  Mom denies having any questions.  Recommended skin to skin, and cue based feedings. Engorgement prevention and treatment discussed. Reminded Mom of OP Lactation services available to her.  To call prn.     Judee ClaraSmith, Maeli Spacek E 12/27/2015, 12:34 PM

## 2015-12-27 NOTE — Progress Notes (Signed)
Mother refused Tdap, Flu and MMR vaccine. Mother informed that she can be at risk getting the [place] measles if she comes in contact with someone who  Has it. She understands risks of not getting the MMR.

## 2015-12-27 NOTE — Discharge Summary (Signed)
OB Discharge Summary  Patient Name: Kristine Mcclure DOB: 23-Jun-1981 MRN: [id number]  Date of admission: 12/26/2015 Delivering MD: Donnel Saxon   Date of discharge: 12/27/2015  Admitting diagnosis: INDUCTION Intrauterine pregnancy: [redacted]w[redacted]d    Secondary diagnosis:Principal Problem:   Vaginal delivery Active Problems:   Normal labor   Polyhydramnios in third trimester  Additional problems: Polyhydramnios in third trimester 12/26/2015   . Rubella non-immune status, antepartum 11/08/2015  . History of preterm delivery--PPROM at 33 weeks, pre-eclampsia 11/08/2015  . H/O severe pre-eclampsia             Discharge diagnosis: post term delivery  Mild anemia                                                                    Post partum procedures:no  Augmentation: none  Complications: None  Hospital course:  Onset of Labor With Vaginal Delivery     35y.o. yo [id number] admitted in Active Labor on 12/26/2015. Patient had an uncomplicated labor course as follows:  Membrane Rupture Time/Date: 10:42 AM ,12/26/2015   Intrapartum Procedures: Episiotomy: None [1]                                         Lacerations:  1st degree [2]  Patient had a delivery of a Viable infant. 12/26/2015  Information for the patient's newborn:  ShCam, [place]0[[id number]]Delivery Method: Vaginal, Spontaneous Delivery (Filed from Delivery Summary)    Pateint had an uncomplicated postpartum course.  She is ambulating, tolerating a regular diet, passing flatus, and urinating well. Patient is discharged home in stable condition on 12/27/2015.    Physical exam  Filed Vitals:   12/26/15 1541 12/26/15 1636 12/26/15 2030 12/27/15 0430  BP: 125/68 125/74 118/63 118/70  Pulse: 82 101 85 78  Temp: 98.6 F (37 C) 98.5 F (36.9 C) 98.2 F (36.8 C) 97.3 F (36.3 C)  TempSrc: Oral Oral Oral Oral  Resp: _0 Height:      Weight:      SpO2: 98% 99% 96% 98%    General: alert, cooperative and no distress Lochia: appropriate Uterine Fundus: firm Incision: Healing well with no significant drainage DVT Evaluation: No evidence of DVT seen on physical exam. Labs: Lab Results  Component Value Date   WBC 20.5* 12/27/2015   HGB 11.7* 12/27/2015   HCT 34.5* 12/27/2015   MCV 86.7 12/27/2015   PLT 223 12/27/2015   CMP Latest Ref Rng 11/08/2015  Glucose 65 - 99 mg/dL 108(H)  BUN 6 - 20 mg/dL 9  Creatinine 0.44 - 1.00 mg/dL 0.63  Sodium 135 - 145 mmol/L 136  Potassium 3.5 - 5.1 mmol/L 3.7  Chloride 101 - 111 mmol/L 106  CO2 22 - 32 mmol/L 21(L)  Calcium 8.9 - 10.3 mg/dL 9.0  Total Protein 6.5 - 8.1 g/dL 6.8  Total Bilirubin 0.3 - 1.2 mg/dL 0.4  Alkaline Phos 38 - 126 U/L 86  AST 15 - 41 U/L 33  ALT 14 - 54 U/L 51    Discharge instruction: per After Visit Summary and "Baby  and Me Booklet".  Medications:  Current facility-administered medications:  .  acetaminophen (TYLENOL) tablet 650 mg, 650 mg, Oral, Q4H PRN, Donnel Saxon, CNM .  benzocaine-Menthol (DERMOPLAST) 20-0.5 % topical spray 1 application, 1 application, Topical, PRN, Donnel Saxon, CNM .  witch hazel-glycerin (TUCKS) pad 1 application, 1 application, Topical, PRN, 1 application at 59/93/57 1734 **AND** dibucaine (NUPERCAINAL) 1 % rectal ointment 1 application, 1 application, Rectal, PRN, Donnel Saxon, CNM, 1 application at 01/77/93 1734 .  diphenhydrAMINE (BENADRYL) capsule 25 mg, 25 mg, Oral, Q6H PRN, Donnel Saxon, CNM .  ibuprofen (ADVIL,MOTRIN) tablet 600 mg, 600 mg, Oral, 4 times per day, Donnel Saxon, CNM, 600 mg at 12/27/15 1206 .  Influenza vac split quadrivalent PF (FLUARIX) injection 0.5 mL, 0.5 mL, Intramuscular, Tomorrow-1000, Naima Dillard, MD .  lanolin ointment, , Topical, PRN, Donnel Saxon, CNM .  measles, mumps and rubella vaccine (MMR) injection 0.5 mL, 0.5 mL, Subcutaneous, Once, Naima Dillard, MD .  methylergonovine (METHERGINE) tablet 0.2 mg, 0.2 mg, Oral, QID,  Donnel Saxon, CNM, 0.2 mg at 12/27/15 1021 .  ondansetron (ZOFRAN) tablet 4 mg, 4 mg, Oral, Q4H PRN **OR** ondansetron (ZOFRAN) injection 4 mg, 4 mg, Intravenous, Q4H PRN, Donnel Saxon, CNM .  oxyCODONE (Oxy IR/ROXICODONE) immediate release tablet 10 mg, 10 mg, Oral, Q4H PRN, Donnel Saxon, CNM .  oxyCODONE (Oxy IR/ROXICODONE) immediate release tablet 5 mg, 5 mg, Oral, Q4H PRN, Donnel Saxon, CNM .  prenatal multivitamin tablet 1 tablet, 1 tablet, Oral, Q1200, Donnel Saxon, CNM, 1 tablet at 12/27/15 1206 .  senna-docusate (Senokot-S) tablet 2 tablet, 2 tablet, Oral, Q24H, Donnel Saxon, CNM, 2 tablet at 12/26/15 2343 .  simethicone (MYLICON) chewable tablet 80 mg, 80 mg, Oral, PRN, Donnel Saxon, CNM .  Tdap (BOOSTRIX) injection 0.5 mL, 0.5 mL, Intramuscular, Once, Donnel Saxon, CNM .  zolpidem (AMBIEN) tablet 5 mg, 5 mg, Oral, QHS PRN, Donnel Saxon, CNM After Visit Meds:    Medication List    STOP taking these medications        calcium carbonate 500 MG chewable tablet  Commonly known as:  TUMS - dosed in mg elemental calcium      TAKE these medications        acetaminophen 325 MG tablet  Commonly known as:  TYLENOL  Take 2 tablets (650 mg total) by mouth every 4 (four) hours as needed (for pain scale < 4).     ibuprofen 600 MG tablet  Commonly known as:  ADVIL,MOTRIN  Take 1 tablet (600 mg total) by mouth every 6 (six) hours.     prenatal vitamin w/FE, FA 27-1 MG Tabs tablet  Take 1 tablet by mouth at bedtime.     ZANTAC PO  Take 1 tablet by mouth daily as needed. For heartburn.        Diet: routine diet  Activity: Advance as tolerated. Pelvic rest for 6 weeks.   Outpatient follow up:6 weeks Follow up Appt:No future appointments. Follow up visit: No Follow-up on file.  Postpartum contraception: Undecided  Newborn Data: Live born female  Birth Weight: 8 lb 1 oz (3657 g) APGAR: 8, 9  Baby Feeding: Breast Disposition:home with mother   12/27/2015 Forrestine Lecrone,  CNM      Postpartum Care After Vaginal Delivery  After you deliver your newborn (postpartum period), the usual stay in the hospital is 24 72 hours. If there were problems with your labor or delivery, or if you have other medical problems, you might be in the hospital  longer.  While you are in the hospital, you will receive help and instructions on how to care for yourself and your newborn during the postpartum period.  While you are in the hospital:  Be sure to tell your nurses if you have pain or discomfort, as well as where you feel the pain and what makes the pain worse.  If you had an incision made near your vagina (episiotomy) or if you had some tearing during delivery, the nurses may put ice packs on your episiotomy or tear. The ice packs may help to reduce the pain and swelling.  If you are breastfeeding, you may feel uncomfortable contractions of your uterus for a couple of weeks. This is normal. The contractions help your uterus get back to normal size.  It is normal to have some bleeding after delivery.  For the first 1 3 days after delivery, the flow is red and the amount may be similar to a period.  It is common for the flow to start and stop.  In the first few days, you may pass some small clots. Let your nurses know if you begin to pass large clots or your flow increases.  Do not  flush blood clots down the toilet before having the nurse look at them.  During the next 3 10 days after delivery, your flow should become more watery and pink or brown-tinged in color.  Ten to fourteen days after delivery, your flow should be a small amount of yellowish-white discharge.  The amount of your flow will decrease over the first few weeks after delivery. Your flow may stop in 6 8 weeks. Most women have had their flow stop by 12 weeks after delivery.  You should change your sanitary pads frequently.  Wash your hands thoroughly with soap and water for at least 20 seconds after  changing pads, using the toilet, or before holding or feeding your newborn.  You should feel like you need to empty your bladder within the first 6 8 hours after delivery.  In case you become weak, lightheaded, or faint, call your nurse before you get out of bed for the first time and before you take a shower for the first time.  Within the first few days after delivery, your breasts may begin to feel tender and full. This is called engorgement. Breast tenderness usually goes away within 48 72 hours after engorgement occurs. You may also notice milk leaking from your breasts. If you are not breastfeeding, do not stimulate your breasts. Breast stimulation can make your breasts produce more milk.  Spending as much time as possible with your newborn is very important. During this time, you and your newborn can feel close and get to know each other. Having your newborn stay in your room (rooming in) will help to strengthen the bond with your newborn. It will give you time to get to know your newborn and become comfortable caring for your newborn.  Your hormones change after delivery. Sometimes the hormone changes can temporarily cause you to feel sad or tearful. These feelings should not last more than a few days. If these feelings last longer than that, you should talk to your caregiver.  If desired, talk to your caregiver about methods of family planning or contraception.  Talk to your caregiver about immunizations. Your caregiver may want you to have the following immunizations before leaving the hospital:  Tetanus, diphtheria, and pertussis (Tdap) or tetanus and diphtheria (Td) immunization. It is very important that  you and your family (including grandparents) or others caring for your newborn are up-to-date with the Tdap or Td immunizations. The Tdap or Td immunization can help protect your newborn from getting ill.  Rubella immunization.  Varicella (chickenpox) immunization.  Influenza  immunization. You should receive this annual immunization if you did not receive the immunization during your pregnancy. Document Released: 07/26/2007 Document Revised: 06/22/2012 Document Reviewed: 05/25/2012 [facility] Patient Information 2014 [place].   Postpartum Depression and Baby Blues  The postpartum period begins right after the birth of a baby. During this time, there is often a great amount of joy and excitement. It is also a time of considerable changes in the life of the parent(s). Regardless of how many times a mother gives birth, each child brings new challenges and dynamics to the family. It is not unusual to have feelings of excitement accompanied by confusing shifts in moods, emotions, and thoughts. All mothers are at risk of developing postpartum depression or the "baby blues." These mood changes can occur right after giving birth, or they may occur many months after giving birth. The baby blues or postpartum depression can be mild or severe. Additionally, postpartum depression can resolve rather quickly, or it can be a long-term condition. CAUSES Elevated hormones and their rapid decline are thought to be a main cause of postpartum depression and the baby blues. There are a number of hormones that radically change during and after pregnancy. Estrogen and progesterone usually decrease immediately after delivering your baby. The level of thyroid hormone and various cortisol steroids also rapidly drop. Other factors that play a major role in these changes include major life events and genetics.  RISK FACTORS If you have any of the following risks for the baby blues or postpartum depression, know what symptoms to watch out for during the postpartum period. Risk factors that may increase the likelihood of getting the baby blues or postpartum depression include: 1. Havinga personal or family history of depression. 2. Having depression while being pregnant. 3. Having premenstrual or  oral contraceptive-associated mood issues. 4. Having exceptional life stress. 5. Having marital conflict. 6. Lacking a social support network. 7. Having a baby with special needs. 8. Having health problems such as diabetes. SYMPTOMS Baby blues symptoms include:  Brief fluctuations in mood, such as going from extreme happiness to sadness.  Decreased concentration.  Difficulty sleeping.  Crying spells, tearfulness.  Irritability.  Anxiety. Postpartum depression symptoms typically begin within the first month after giving birth. These symptoms include:  Difficulty sleeping or excessive sleepiness.  Marked weight loss.  Agitation.  Feelings of worthlessness.  Lack of interest in activity or food. Postpartum psychosis is a very concerning condition and can be dangerous. Fortunately, it is rare. Displaying any of the following symptoms is cause for immediate medical attention. Postpartum psychosis symptoms include:  Hallucinations and delusions.  Bizarre or disorganized behavior.  Confusion or disorientation. DIAGNOSIS  A diagnosis is made by an evaluation of your symptoms. There are no medical or lab tests that lead to a diagnosis, but there are various questionnaires that a caregiver may use to identify those with the baby blues, postpartum depression, or psychosis. Often times, a screening tool called the [place] Postnatal Depression Scale is used to diagnose depression in the postpartum period.  TREATMENT The baby blues usually goes away on its own in 1 to 2 weeks. Social support is often all that is needed. You should be encouraged to get adequate sleep and rest. Occasionally, you may  be given medicines to help you sleep.  Postpartum depression requires treatment as it can last several months or longer if it is not treated. Treatment may include individual or group therapy, medicine, or both to address any social, physiological, and psychological factors that may play a  role in the depression. Regular exercise, a healthy diet, rest, and social support may also be strongly recommended.  Postpartum psychosis is more serious and needs treatment right away. Hospitalization is often needed. HOME CARE INSTRUCTIONS  Get as much rest as you can. Nap when the baby sleeps.  Exercise regularly. Some women find yoga and walking to be beneficial.  Eat a balanced and nourishing diet.  Do little things that you enjoy. Have a cup of tea, take a bubble bath, read your favorite magazine, or listen to your favorite music.  Avoid alcohol.  Ask for help with household chores, cooking, grocery shopping, or running errands as needed. Do not try to do everything.  Talk to people close to you about how you are feeling. Get support from your partner, family members, friends, or other new moms.  Try to stay positive in how you think. Think about the things you are grateful for.  Do not spend a lot of time alone.  Only take medicine as directed by your caregiver.  Keep all your postpartum appointments.  Let your caregiver know if you have any concerns. SEEK MEDICAL CARE IF: You are having a reaction or problems with your medicine. SEEK IMMEDIATE MEDICAL CARE IF:  You have suicidal feelings.  You feel you may harm the baby or someone else. Document Released: 07/02/2004 Document Revised: 12/21/2011 Document Reviewed: 08/04/2011 [facility] Patient Information 2014 [place], [place].     Breastfeeding Deciding to breastfeed is one of the best choices you can make for you and your baby. A change in hormones during pregnancy causes your breast tissue to grow and increases the number and size of your milk ducts. These hormones also allow proteins, sugars, and fats from your blood supply to make breast milk in your milk-producing glands. Hormones prevent breast milk from being released before your baby is born as well as prompt milk flow after birth. Once breastfeeding has begun,  thoughts of your baby, as well as his or her sucking or crying, can stimulate the release of milk from your milk-producing glands.  BENEFITS OF BREASTFEEDING For Your Baby  Your first milk (colostrum) helps your baby's digestive system function better.   There are antibodies in your milk that help your baby fight off infections.   Your baby has a lower incidence of asthma, allergies, and sudden infant death syndrome.   The nutrients in breast milk are better for your baby than infant formulas and are designed uniquely for your baby's needs.   Breast milk improves your baby's brain development.   Your baby is less likely to develop other conditions, such as childhood obesity, asthma, or type 2 diabetes mellitus.  For You   Breastfeeding helps to create a very special bond between you and your baby.   Breastfeeding is convenient. Breast milk is always available at the correct temperature and costs nothing.   Breastfeeding helps to burn calories and helps you lose the weight gained during pregnancy.   Breastfeeding makes your uterus contract to its prepregnancy size faster and slows bleeding (lochia) after you give birth.   Breastfeeding helps to lower your risk of developing type 2 diabetes mellitus, osteoporosis, and breast or ovarian cancer later in life.  SIGNS THAT YOUR BABY IS HUNGRY Early Signs of Hunger  Increased alertness or activity.  Stretching.  Movement of the head from side to side.  Movement of the head and opening of the mouth when the corner of the mouth or cheek is stroked (rooting).  Increased sucking sounds, smacking lips, cooing, sighing, or squeaking.  Hand-to-mouth movements.  Increased sucking of fingers or hands. Late Signs of Hunger  Fussing.  Intermittent crying. Extreme Signs of Hunger Signs of extreme hunger will require calming and consoling before your baby will be able to breastfeed successfully. Do not wait for the following  signs of extreme hunger to occur before you initiate breastfeeding:   Restlessness.  A loud, strong cry.   Screaming.   BREASTFEEDING BASICS Breastfeeding Initiation  Find a comfortable place to sit or lie down, with your neck and back well supported.  Place a pillow or rolled up blanket under your baby to bring him or her to the level of your breast (if you are seated). Nursing pillows are specially designed to help support your arms and your baby while you breastfeed.  Make sure that your baby's abdomen is facing your abdomen.   Gently massage your breast. With your fingertips, massage from your chest wall toward your nipple in a circular motion. This encourages milk flow. You may need to continue this action during the feeding if your milk flows slowly.  Support your breast with 4 fingers underneath and your thumb above your nipple. Make sure your fingers are well away from your nipple and your baby's mouth.   Stroke your baby's lips gently with your finger or nipple.   When your baby's mouth is open wide enough, quickly bring your baby to your breast, placing your entire nipple and as much of the colored area around your nipple (areola) as possible into your baby's mouth.   More areola should be visible above your baby's upper lip than below the lower lip.   Your baby's tongue should be between his or her lower gum and your breast.   Ensure that your baby's mouth is correctly positioned around your nipple (latched). Your baby's lips should create a seal on your breast and be turned out (everted).  It is common for your baby to suck about 2-3 minutes in order to start the flow of breast milk. Latching Teaching your baby how to latch on to your breast properly is very important. An improper latch can cause nipple pain and decreased milk supply for you and poor weight gain in your baby. Also, if your baby is not latched onto your nipple properly, he or she may swallow some  air during feeding. This can make your baby fussy. Burping your baby when you switch breasts during the feeding can help to get rid of the air. However, teaching your baby to latch on properly is still the best way to prevent fussiness from swallowing air while breastfeeding. Signs that your baby has successfully latched on to your nipple:    Silent tugging or silent sucking, without causing you pain.   Swallowing heard between every 3-4 sucks.    Muscle movement above and in front of his or her ears while sucking.  Signs that your baby has not successfully latched on to nipple:   Sucking sounds or smacking sounds from your baby while breastfeeding.  Nipple pain. If you think your baby has not latched on correctly, slip your finger into the corner of your baby's mouth to break  the suction and place it between your baby's gums. Attempt breastfeeding initiation again. Signs of Successful Breastfeeding Signs from your baby:   A gradual decrease in the number of sucks or complete cessation of sucking.   Falling asleep.   Relaxation of his or her body.   Retention of a small amount of milk in his or her mouth.   Letting go of your breast by himself or herself. Signs from you:  Breasts that have increased in firmness, weight, and size 1-3 hours after feeding.   Breasts that are softer immediately after breastfeeding.  Increased milk volume, as well as a change in milk consistency and color by the fifth day of breastfeeding.   Nipples that are not sore, cracked, or bleeding. Signs That Your [employer]el Books is Getting Enough Milk  Wetting at least 3 diapers in a 24-hour period. The urine should be clear and pale yellow by age 84 days.  At least 3 stools in a 24-hour period by age 84 days. The stool should be soft and yellow.  At least 3 stools in a 24-hour period by age 36 days. The stool should be seedy and yellow.  No loss of weight greater than 10% of birth weight during the first  39 days of age.  Average weight gain of 4-7 ounces (113-198 g) per week after age 38 days.  Consistent daily weight gain by age 69 days, without weight loss after the age of 2 weeks. After a feeding, your baby may spit up a small amount. This is common. BREASTFEEDING FREQUENCY AND DURATION Frequent feeding will help you make more milk and can prevent sore nipples and breast engorgement. Breastfeed when you feel the need to reduce the fullness of your breasts or when your baby shows signs of hunger. This is called "breastfeeding on demand." Avoid introducing a pacifier to your baby while you are working to establish breastfeeding (the first 4-6 weeks after your baby is born). After this time you may choose to use a pacifier. Research has shown that pacifier use during the first year of a baby's life decreases the risk of sudden infant death syndrome (SIDS). Allow your baby to feed on each breast as long as he or she wants. Breastfeed until your baby is finished feeding. When your baby unlatches or falls asleep while feeding from the first breast, offer the second breast. Because newborns are often sleepy in the first few weeks of life, you may need to awaken your baby to get him or her to feed. Breastfeeding times will vary from baby to baby. However, the following rules can serve as a guide to help you ensure that your baby is properly fed:  Newborns (babies 61 weeks of age or younger) may breastfeed every 1-3 hours.  Newborns should not go longer than 3 hours during the day or 5 hours during the night without breastfeeding.  You should breastfeed your baby a minimum of 8 times in a 24-hour period until you begin to introduce solid foods to your baby at around 63 months of age. BREAST MILK PUMPING Pumping and storing breast milk allows you to ensure that your baby is exclusively fed your breast milk, even at times when you are unable to breastfeed. This is especially important if you are going back to  work while you are still breastfeeding or when you are not able to be present during feedings. Your lactation consultant can give you guidelines on how long it is safe to store breast milk.  A breast pump is a machine that allows you to pump milk from your breast into a sterile bottle. The pumped breast milk can then be stored in a refrigerator or freezer. Some breast pumps are operated by hand, while others use electricity. Ask your lactation consultant which type will work best for you. Breast pumps can be purchased, but some hospitals and breastfeeding support groups lease breast pumps on a monthly basis. A lactation consultant can teach you how to hand express breast milk, if you prefer not to use a pump.  CARING FOR YOUR BREASTS WHILE YOU BREASTFEED Nipples can become dry, cracked, and sore while breastfeeding. The following recommendations can help keep your breasts moisturized and healthy:  Avoid using soap on your nipples.   Wear a supportive bra. Although not required, special nursing bras and tank tops are designed to allow access to your breasts for breastfeeding without taking off your entire bra or top. Avoid wearing underwire-style bras or extremely tight bras.  Air dry your nipples for 3-10mnutes after each feeding.   Use only cotton bra pads to absorb leaked breast milk. Leaking of breast milk between feedings is normal.   Use lanolin on your nipples after breastfeeding. Lanolin helps to maintain your skin's normal moisture barrier. If you use pure lanolin, you do not need to wash it off before feeding your baby again. Pure lanolin is not toxic to your baby. You may also hand express a few drops of breast milk and gently massage that milk into your nipples and allow the milk to air dry. In the first few weeks after giving birth, some women experience extremely full breasts (engorgement). Engorgement can make your breasts feel heavy, warm, and tender to the touch. Engorgement peaks  within 3-5 days after you give birth. The following recommendations can help ease engorgement:  Completely empty your breasts while breastfeeding or pumping. You may want to start by applying warm, moist heat (in the shower or with warm water-soaked hand towels) just before feeding or pumping. This increases circulation and helps the milk flow. If your baby does not completely empty your breasts while breastfeeding, pump any extra milk after he or she is finished.  Wear a snug bra (nursing or regular) or tank top for 1-2 days to signal your body to slightly decrease milk production.  Apply ice packs to your breasts, unless this is too uncomfortable for you.  Make sure that your baby is latched on and positioned properly while breastfeeding. If engorgement persists after 48 hours of following these recommendations, contact your health care provider or a lScience writer OVERALL HEALTH CARE RECOMMENDATIONS WHILE BREASTFEEDING  Eat healthy foods. Alternate between meals and snacks, eating 3 of each per day. Because what you eat affects your breast milk, some of the foods may make your baby more irritable than usual. Avoid eating these foods if you are sure that they are negatively affecting your baby.  Drink milk, fruit juice, and water to satisfy your thirst (about 10 glasses a day).   Rest often, relax, and continue to take your prenatal vitamins to prevent fatigue, stress, and anemia.  Continue breast self-awareness checks.  Avoid chewing and smoking tobacco.  Avoid alcohol and drug use. Some medicines that may be harmful to your baby can pass through breast milk. It is important to ask your health care provider before taking any medicine, including all over-the-counter and prescription medicine as well as vitamin and herbal supplements. It is possible to become pregnant while  breastfeeding. If birth control is desired, ask your health care provider about options that will be safe for  your baby. SEEK MEDICAL CARE IF:   You feel like you want to stop breastfeeding or have become frustrated with breastfeeding.  You have painful breasts or nipples.  Your nipples are cracked or bleeding.  Your breasts are red, tender, or warm.  You have a swollen area on either breast.  You have a fever or chills.  You have nausea or vomiting.  You have drainage other than breast milk from your nipples.  Your breasts do not become full before feedings by the fifth day after you give birth.  You feel sad and depressed.  Your baby is too sleepy to eat well.  Your baby is having trouble sleeping.   Your baby is wetting less than 3 diapers in a 24-hour period.  Your baby has less than 3 stools in a 24-hour period.  Your baby's skin or the white part of his or her eyes becomes yellow.   Your baby is not gaining weight by 69 days of age. SEEK IMMEDIATE MEDICAL CARE IF:   Your baby is overly tired (lethargic) and does not want to wake up and feed.  Your baby develops an unexplained fever. Document Released: 09/28/2005 Document Revised: 10/03/2013 Document Reviewed: 03/22/2013 [facility] Patient Information 2015 [place], [place]. This information is not intended to replace advice given to you by your health care provider. Make sure you discuss any questions you have with your health care provider.

## 2015-12-28 ENCOUNTER — Inpatient Hospital Stay (HOSPITAL_COMMUNITY): Admission: RE | Admit: 2015-12-28 | Payer: Self-pay | Source: Ambulatory Visit

## 2016-01-04 ENCOUNTER — Inpatient Hospital Stay (HOSPITAL_COMMUNITY): Admission: RE | Admit: 2016-01-04 | Payer: Self-pay | Source: Ambulatory Visit

## 2020-01-26 LAB — OB RESULTS CONSOLE RPR: RPR: NONREACTIVE

## 2020-01-26 LAB — OB RESULTS CONSOLE HEPATITIS B SURFACE ANTIGEN: Hepatitis B Surface Ag: NEGATIVE

## 2020-01-26 LAB — OB RESULTS CONSOLE ANTIBODY SCREEN: Antibody Screen: NEGATIVE

## 2020-01-26 LAB — OB RESULTS CONSOLE GC/CHLAMYDIA
Chlamydia: NEGATIVE
Gonorrhea: NEGATIVE

## 2020-01-26 LAB — OB RESULTS CONSOLE ABO/RH: RH Type: POSITIVE

## 2020-01-26 LAB — OB RESULTS CONSOLE RUBELLA ANTIBODY, IGM: Rubella: NON-IMMUNE/NOT IMMUNE

## 2020-01-26 LAB — OB RESULTS CONSOLE HIV ANTIBODY (ROUTINE TESTING): HIV: NONREACTIVE

## 2020-06-12 ENCOUNTER — Ambulatory Visit: Payer: BLUE CROSS/BLUE SHIELD

## 2020-07-18 LAB — OB RESULTS CONSOLE GBS: GBS: NEGATIVE

## 2020-07-30 ENCOUNTER — Telehealth (HOSPITAL_COMMUNITY): Payer: Self-pay | Admitting: *Deleted

## 2020-07-30 ENCOUNTER — Encounter (HOSPITAL_COMMUNITY): Payer: Self-pay | Admitting: *Deleted

## 2020-07-30 NOTE — Telephone Encounter (Signed)
Preadmission screen  

## 2020-08-05 ENCOUNTER — Other Ambulatory Visit (HOSPITAL_COMMUNITY): Payer: BLUE CROSS/BLUE SHIELD

## 2020-08-05 ENCOUNTER — Other Ambulatory Visit: Payer: Self-pay

## 2020-08-05 ENCOUNTER — Telehealth (HOSPITAL_COMMUNITY): Payer: Self-pay | Admitting: *Deleted

## 2020-08-05 NOTE — Telephone Encounter (Signed)
Preadmission screen  

## 2020-08-06 ENCOUNTER — Other Ambulatory Visit (HOSPITAL_COMMUNITY)
Admission: RE | Admit: 2020-08-06 | Discharge: 2020-08-06 | Disposition: A | Discharge status: Home / Self Care | Payer: Managed Care, Other (non HMO) | Source: Ambulatory Visit | Attending: Obstetrics and Gynecology | Admitting: Obstetrics and Gynecology

## 2020-08-06 DIAGNOSIS — Z01812 Encounter for preprocedural laboratory examination: Secondary | ICD-10-CM | POA: Insufficient documentation

## 2020-08-06 DIAGNOSIS — Z20822 Contact with and (suspected) exposure to covid-19: Secondary | ICD-10-CM | POA: Insufficient documentation

## 2020-08-06 LAB — SARS CORONAVIRUS 2 (TAT 6-24 HRS): SARS Coronavirus 2: NEGATIVE

## 2020-08-07 ENCOUNTER — Inpatient Hospital Stay (HOSPITAL_COMMUNITY): Payer: Managed Care, Other (non HMO)

## 2020-08-07 ENCOUNTER — Inpatient Hospital Stay (HOSPITAL_COMMUNITY)
Admission: AD | Admit: 2020-08-07 | Discharge: 2020-08-11 | DRG: 787 | Disposition: A | Discharge status: Home / Self Care | Payer: Managed Care, Other (non HMO) | Attending: Obstetrics & Gynecology | Admitting: Obstetrics & Gynecology

## 2020-08-07 ENCOUNTER — Encounter (HOSPITAL_COMMUNITY): Payer: Self-pay

## 2020-08-07 ENCOUNTER — Other Ambulatory Visit: Payer: Self-pay

## 2020-08-07 ENCOUNTER — Inpatient Hospital Stay (HOSPITAL_COMMUNITY): Payer: Managed Care, Other (non HMO) | Admitting: Anesthesiology

## 2020-08-07 DIAGNOSIS — D65 Disseminated intravascular coagulation [defibrination syndrome]: Secondary | ICD-10-CM | POA: Diagnosis not present

## 2020-08-07 DIAGNOSIS — O24420 Gestational diabetes mellitus in childbirth, diet controlled: Principal | ICD-10-CM | POA: Diagnosis present

## 2020-08-07 DIAGNOSIS — O9081 Anemia of the puerperium: Secondary | ICD-10-CM | POA: Diagnosis not present

## 2020-08-07 DIAGNOSIS — D62 Acute posthemorrhagic anemia: Secondary | ICD-10-CM | POA: Diagnosis not present

## 2020-08-07 DIAGNOSIS — O99892 Other specified diseases and conditions complicating childbirth: Secondary | ICD-10-CM | POA: Diagnosis present

## 2020-08-07 DIAGNOSIS — Z3A39 39 weeks gestation of pregnancy: Secondary | ICD-10-CM

## 2020-08-07 DIAGNOSIS — Z20822 Contact with and (suspected) exposure to covid-19: Secondary | ICD-10-CM | POA: Diagnosis present

## 2020-08-07 DIAGNOSIS — O99214 Obesity complicating childbirth: Secondary | ICD-10-CM | POA: Diagnosis present

## 2020-08-07 DIAGNOSIS — Z2839 Other underimmunization status: Secondary | ICD-10-CM

## 2020-08-07 DIAGNOSIS — Z3490 Encounter for supervision of normal pregnancy, unspecified, unspecified trimester: Secondary | ICD-10-CM | POA: Diagnosis present

## 2020-08-07 DIAGNOSIS — O09899 Supervision of other high risk pregnancies, unspecified trimester: Secondary | ICD-10-CM

## 2020-08-07 DIAGNOSIS — O26813 Pregnancy related exhaustion and fatigue, third trimester: Secondary | ICD-10-CM | POA: Diagnosis not present

## 2020-08-07 DIAGNOSIS — O1490 Unspecified pre-eclampsia, unspecified trimester: Secondary | ICD-10-CM | POA: Diagnosis present

## 2020-08-07 DIAGNOSIS — O3663X0 Maternal care for excessive fetal growth, third trimester, not applicable or unspecified: Secondary | ICD-10-CM | POA: Diagnosis present

## 2020-08-07 DIAGNOSIS — Z283 Underimmunization status: Secondary | ICD-10-CM

## 2020-08-07 DIAGNOSIS — O9902 Anemia complicating childbirth: Secondary | ICD-10-CM | POA: Diagnosis not present

## 2020-08-07 HISTORY — DX: Encounter for supervision of normal pregnancy, unspecified, unspecified trimester: Z34.90

## 2020-08-07 LAB — PROTEIN / CREATININE RATIO, URINE
Creatinine, Urine: 49.01 mg/dL
Protein Creatinine Ratio: 0.18 mg/mg{Cre} — ABNORMAL HIGH (ref 0.00–0.15)
Total Protein, Urine: 9 mg/dL

## 2020-08-07 LAB — RPR: RPR Ser Ql: NONREACTIVE

## 2020-08-07 LAB — COMPREHENSIVE METABOLIC PANEL
ALT: 52 U/L — ABNORMAL HIGH (ref 0–44)
AST: 34 U/L (ref 15–41)
Albumin: 3 g/dL — ABNORMAL LOW (ref 3.5–5.0)
Alkaline Phosphatase: 116 U/L (ref 38–126)
Anion gap: 9 (ref 5–15)
BUN: 8 mg/dL (ref 6–20)
CO2: 21 mmol/L — ABNORMAL LOW (ref 22–32)
Calcium: 9.4 mg/dL (ref 8.9–10.3)
Chloride: 106 mmol/L (ref 98–111)
Creatinine, Ser: 0.81 mg/dL (ref 0.44–1.00)
GFR, Estimated: >60 mL/min (ref >60)
Glucose, Bld: 77 mg/dL (ref 70–99)
Potassium: 4.2 mmol/L (ref 3.5–5.1)
Sodium: 136 mmol/L (ref 135–145)
Total Bilirubin: 0.6 mg/dL (ref 0.3–1.2)
Total Protein: 6.1 g/dL — ABNORMAL LOW (ref 6.5–8.1)

## 2020-08-07 LAB — CBC
HCT: 39.5 % (ref 36.0–46.0)
Hemoglobin: 13.6 g/dL (ref 12.0–15.0)
MCH: 30.7 pg (ref 26.0–34.0)
MCHC: 34.4 g/dL (ref 30.0–36.0)
MCV: 89.2 fL (ref 80.0–100.0)
Platelets: 184 10*3/uL (ref 150–400)
RBC: 4.43 MIL/uL (ref 3.87–5.11)
RDW: 14 % (ref 11.5–15.5)
WBC: 9.3 10*3/uL (ref 4.0–10.5)
nRBC: 0 % (ref 0.0–0.2)

## 2020-08-07 LAB — GLUCOSE, CAPILLARY: Glucose-Capillary: 77 mg/dL (ref 70–99)

## 2020-08-07 MED ORDER — DIPHENHYDRAMINE HCL 50 MG/ML IJ SOLN: 12.5000 mg | INTRAMUSCULAR | Status: DC | PRN

## 2020-08-07 MED ORDER — EPHEDRINE 5 MG/ML INJ: 10.0000 mg | INTRAVENOUS | Status: DC | PRN

## 2020-08-07 MED ORDER — LIDOCAINE HCL (PF) 1 % IJ SOLN
INTRAMUSCULAR | Status: DC | PRN
  Administered 2020-08-07: 6 mL via EPIDURAL

## 2020-08-07 MED ORDER — SOD CITRATE-CITRIC ACID 500-334 MG/5ML PO SOLN: 30.0000 mL | ORAL | Status: DC | PRN

## 2020-08-07 MED ORDER — TERBUTALINE SULFATE 1 MG/ML IJ SOLN: 0.2500 mg | Freq: Once | INTRAMUSCULAR | Status: DC | PRN

## 2020-08-07 MED ORDER — OXYTOCIN 10 UNIT/ML IJ SOLN: 10.0000 [IU] | Freq: Once | INTRAMUSCULAR | Status: DC

## 2020-08-07 MED ORDER — SODIUM CHLORIDE (PF) 0.9 % IJ SOLN
INTRAMUSCULAR | Status: DC | PRN
  Administered 2020-08-07: 12 mL/h via EPIDURAL

## 2020-08-07 MED ORDER — FENTANYL-BUPIVACAINE-NACL 0.5-0.125-0.9 MG/250ML-% EP SOLN
EPIDURAL | Status: AC
  Filled 2020-08-07: qty 250

## 2020-08-07 MED ORDER — FENTANYL-BUPIVACAINE-NACL 0.5-0.125-0.9 MG/250ML-% EP SOLN: 12.0000 mL/h | EPIDURAL | Status: DC | PRN

## 2020-08-07 MED ORDER — LACTATED RINGERS IV SOLN
500.0000 mL | Freq: Once | INTRAVENOUS | Status: AC
  Administered 2020-08-07: 500 mL via INTRAVENOUS

## 2020-08-07 MED ORDER — MISOPROSTOL 50MCG HALF TABLET
50.0000 ug | ORAL_TABLET | ORAL | Status: DC | PRN
  Administered 2020-08-07: 50 ug via BUCCAL
  Filled 2020-08-07: qty 1

## 2020-08-07 MED ORDER — PHENYLEPHRINE 40 MCG/ML (10ML) SYRINGE FOR IV PUSH (FOR BLOOD PRESSURE SUPPORT): 80.0000 ug | PREFILLED_SYRINGE | INTRAVENOUS | Status: DC | PRN

## 2020-08-07 MED ORDER — ONDANSETRON HCL 4 MG/2ML IJ SOLN: 4.0000 mg | Freq: Four times a day (QID) | INTRAMUSCULAR | Status: DC | PRN

## 2020-08-07 MED ORDER — ACETAMINOPHEN 325 MG PO TABS: 650.0000 mg | ORAL_TABLET | ORAL | Status: DC | PRN

## 2020-08-07 MED ORDER — LACTATED RINGERS IV SOLN: INTRAVENOUS | Status: DC

## 2020-08-07 MED ORDER — LIDOCAINE HCL (PF) 1 % IJ SOLN: 30.0000 mL | INTRAMUSCULAR | Status: DC | PRN

## 2020-08-07 NOTE — Progress Notes (Signed)
S: Sitting on BB, mild cramping w/ ctx. Spouse and doula present and supportive.  Denies PEC sx.   O: VSSAF  FHR 135 on IA, no audible decels Ctx q 3 min, mild  VE 6/80/-1, -2  BBOW, AROM clear AF, copious amounts of fluid released, head well applied afterwards.   A/P: IOL at term S/p miso and IF FHT category 1  Now AROM and anticipate physiologic labor  PEC w/o SF BP normotensive, no neural sx  Continue to monitor closely  Anticipate NSVB.   Neta Mends, MSN, CNM 08/07/2020, 3:16 PM

## 2020-08-07 NOTE — Progress Notes (Addendum)
S: Active 2nd stage since 10 pm, feeling lots of pelvic pressure with ctx.  Pushed in L and R lateral position, semi fowler's with tug'o'war, and closed knees pushing.  Starting to fatigue.  Doula, spouse and CNM at [facility] for continuous support.   O: Vitals:   08/07/20 2122 08/07/20 2125 08/07/20 2130 08/07/20 2301  BP:  (!) 130/108 123/84 130/90  Pulse:  (!) 108 (!) 104 (!) 126  Resp:  18 17   Temp: 98.7 F (37.1 C)     TempSrc: Axillary     SpO2:  100% 100%   Weight:      Height:        FHR 140, mod var, + accels, + variables to 70 BPM with passive descent, no decels when actively pushing Ctx q 2-3 strong.   VE 100/c/+2 + caput and molding Minimal descent for past hour  A/P: IOL at term for LGA PEC w/o severe features FHT cat 1 and 2   Protracted active 2nd stage, now with maternal fatigue  Patient placed in modified Walcher's for passive descent and rest. Epidural bolus for comfort Guarded for mode of delivery  Neta Mends, MSN, CNM 08/07/2020, 11:44 PM   Addendum:  Attempted passive descent for past 40 min, fetal intolerance with deep repetitive variables despite position changes.  Resumed pushing and minimal descent noted past +2 station.   Recommend cesarean section for arrest of descent and patient agrees.  Risk of C/S reviewed including infection, bleeding, poss need for blood transfusion and its risk,( HIV, hepatitis, fever/rash) injury to surrounding organ structures, internal scar tissue. All questions answered.  Dr. Ernestina Penna updated and in unit.  Proceed with pre-op prep.   Neta Mends, MSN, CNM 08/08/2020, 12:27 AM

## 2020-08-07 NOTE — H&P (Signed)
OB ADMISSION/ HISTORY & PHYSICAL:  Admission Date: 08/07/2020  7:14 AM  Admit Diagnosis: Encounter for induction of labor [Z34.90]    Kristine Mcclure is a 39 y.o. female presenting for IOL 2/2 LGA, GDM A1, polyhydramnios resolved, now high normal AFI.  Prenatal History: [id number]   EDC : 08/13/2020, by Other Basis  Prenatal care at [facility] since 11 wks.   Prenatal course complicated by: 1. Hx PEC/PPROM 34 wks, declined 17P, on baby ASA 2. GDM A1 well controlled 3. Polyhydramnios, max 33 cm, resolved to high normal 19 cm last week 4. LGA 98% (7#15) at 36 wks. Pelvis proven to 8#3 5. Rubella non-immune  Prenatal Labs: ABO, Rh: A (04/16 0000)  Antibody: PENDING (10/27 5284) Rubella: Nonimmune (04/16 0000)  RPR: Nonreactive (04/16 0000)  HBsAg: Negative (04/16 0000)  HIV: Non-reactive (04/16 0000)  GBS: Negative/-- (10/07 0000)  1 hr Glucola : abnormal 206 Genetic Screening: declined Ultrasound: normal anatomy XY, posterior placenta  Vaccines: TDaP          UTD         Flu             ?                    COVID-19 UTD    Maternal Diabetes: Yes:  Diabetes Type:  Diet controlled Genetic Screening: Declined Maternal Ultrasounds/Referrals: Normal, poly resolved at 38 wks Fetal Ultrasounds or other Referrals:  None Maternal Substance Abuse:  No Significant Maternal Medications:  Meds include: Other: baby ASA Significant Maternal Lab Results:  Group B Strep negative Other Comments:  None  Medical / Surgical History :  Past medical history:  Past Medical History:  Diagnosis Date  . Preterm delivery without spontaneous labor 08/02/2011  . Preterm labor      Past surgical history:  Past Surgical History:  Procedure Laterality Date  . NO PAST SURGERIES    . WISDOM TOOTH EXTRACTION       Family History: History reviewed. No pertinent family history.   Social History:  reports that she has never smoked. She has never used smokeless tobacco. She  reports that she does not drink alcohol and does not use drugs.   Allergies: Patient has no known allergies.   Current Medications at time of admission:  PNV. Baby ASA   Review of Systems: ROS Denies HA/NV/RUQ pain/visual changes.   Good FM, no LOF/VB/ctx  Physical Exam: Vital signs and nursing notes reviewed.  Patient Vitals for the past 24 hrs:  BP Temp Temp src Pulse Resp Height Weight  08/07/20 0749 (!) 133/95 98.3 F (36.8 C) Oral (!) 107 17 5' 3"  (1.6 m) 88.7 kg     General: AAO x 3, NAD, coping well Heart: RRR Lungs:CTAB Abdomen: Gravid, NT, Leopold's vertex, fetal spine to maternal L Extremities: no edema Genitalia / VE: Dilation: 2 Effacement (%): 60 Station: -1 Presentation: Vertex Exam by:: D Arda Daggs CNM  Cervical balloon placed w/ ease, inflated to 60 cc fluid and traction to leg.  Patient tolerated well.   FHR: 120 BPM, mod variability, + accels, no decels TOCO: Ctx none  Labs:   Pending T&S,  RPR, CMP, PCR  Recent Labs    08/07/20 0828  WBC 9.3  HGB 13.6  HCT 39.5  PLT 184       Assessment:  39 y.o. G3P1102 at 39w1dLGA, GDM A1 well controlled Elevated BP w/o PEC sx Hx PEC with G1 on  baby ASA  1. Cervical ripening stage of labor 2. FHR category 1 3. GBS neg 4. Desires unmedicated birth 5. Breastfeeding 6. Placenta disposal per patient request 7. Rubella non-immune 8. Declines prophylactic PIV after counseling  Plan:  1. Admit to BS, PEC labs added 2. Routine L&D orders 3. Analgesia/anesthesia PRN  4. IF and buccal Cytotec for ripening 5. Anticipate NSVB 6. Offer MMR prior to DC   Dr Murrell Redden notified of admission / plan of care   [place], MSN 08/07/2020, 9:13 AM

## 2020-08-07 NOTE — Progress Notes (Signed)
Kristine Mcclure is a 39 y.o. G3P1102 at [redacted]w[redacted]d by ultrasound admitted for IOL 2/2 LGA, poly (resolved)  Subjective: Comfortable with ctx, reports balloon out an hour ago.  Denies PEC neural sx.   Objective: Vitals:   08/07/20 0749 08/07/20 0900 08/07/20 1110  BP: (!) 133/95 (!) 139/94 127/79  Pulse: (!) 107 79 82  Resp: 17 18 16   Temp: 98.3 F (36.8 C)  98.2 F (36.8 C)  TempSrc: Oral  Oral  Weight: 88.7 kg    Height: 5\' 3"  (1.6 m)       FHT:  FHR: 140 bpm, variability: moderate,  accelerations:  Present,  decelerations:  Absent UC:   regular, every 2-3 minutes SVE:   Dilation: 5 Effacement (%): 80 Station: -2 Exam by:: CNM BBOW  Labs:   Recent Labs    08/07/20 0828  WBC 9.3  HGB 13.6  HCT 39.5  PLT 184   CMP Latest Ref Rng & Units 08/07/2020 11/08/2015 08/02/2011  Glucose 70 - 99 mg/dL 77 11/10/2015) 87  BUN 6 - 20 mg/dL 8 9 13   Creatinine 0.44 - 1.00 mg/dL 08/04/2011 366(Q  Sodium 135 - 145 mmol/L 136 136 137  Potassium 3.5 - 5.1 mmol/L 4.2 3.7 3.8  Chloride 98 - 111 mmol/L 106 106 102  CO2 22 - 32 mmol/L 21(L) 21(L) 30  Calcium 8.9 - 10.3 mg/dL 9.4 9.0 9.47)  Total Protein 6.5 - 8.1 g/dL 6.1(L) 6.8 4.3(L)  Total Bilirubin 0.3 - 1.2 mg/dL 0.6 0.4 6.54)  Alkaline Phos 38 - 126 U/L 116 86 58  AST 15 - 41 U/L 34 33 32  ALT 0 - 44 U/L 52(H) 51 51(H)   Assessment / Plan: 6.50 39 y.o. [redacted]w[redacted]d Induction of labor due to LGA,  progressing well on IF and one dose buccal Cytotec   Labor: IF expelled, will allow for physiologic labor, AROM when head well engaged Preeclampsia:  elevated LFT's and PCR 0.18 , creatinine 0.81 BP elevated initially into mild range, now normotensive No neural sx. Continue to monitor closely Fetal Wellbeing:  Category I Pain Control:  Labor support without medications I/D:  GBS neg Anticipated MOD:  NSVD  [id number], CNM, MSN 08/07/2020, 1:10 PM

## 2020-08-07 NOTE — Anesthesia Preprocedure Evaluation (Signed)
Anesthesia Evaluation  Patient identified by MRN, date of birth, ID band Patient awake    Reviewed: Allergy & Precautions, H&P , NPO status , Patient's Chart, lab work & pertinent test results, reviewed documented beta blocker date and time   Airway Mallampati: I  TM Distance: >3 FB Neck ROM: full    Dental no notable dental hx. (+) Teeth Intact, Dental Advisory Given   Pulmonary neg pulmonary ROS,    Pulmonary exam normal breath sounds clear to auscultation       Cardiovascular hypertension, Normal cardiovascular exam Rhythm:regular Rate:Normal     Neuro/Psych negative neurological ROS  negative psych ROS   GI/Hepatic negative GI ROS, Neg liver ROS,   Endo/Other  diabetes, GestationalMorbid obesity  Renal/GU negative Renal ROS  negative genitourinary   Musculoskeletal   Abdominal   Peds  Hematology negative hematology ROS (+)   Anesthesia Other Findings   Reproductive/Obstetrics (+) Pregnancy                             Anesthesia Physical Anesthesia Plan  ASA: II  Anesthesia Plan: Epidural   Post-op Pain Management:    Induction:   PONV Risk Score and Plan:   Airway Management Planned: Natural Airway  Additional Equipment:   Intra-op Plan:   Post-operative Plan:   Informed Consent: I have reviewed the patients History and Physical, chart, labs and discussed the procedure including the risks, benefits and alternatives for the proposed anesthesia with the patient or authorized representative who has indicated his/her understanding and acceptance.       Plan Discussed with: Anesthesiologist  Anesthesia Plan Comments:         Anesthesia Quick Evaluation

## 2020-08-07 NOTE — Anesthesia Procedure Notes (Signed)
Epidural Patient location during procedure: OB Start time: 08/07/2020 8:55 PM End time: 08/07/2020 9:00 PM  Staffing Anesthesiologist: Bethena Midget, MD  Preanesthetic Checklist Completed: patient identified, IV checked, site marked, risks and benefits discussed, surgical consent, monitors and equipment checked, pre-op evaluation and timeout performed  Epidural Patient position: sitting Prep: DuraPrep and site prepped and draped Patient monitoring: continuous pulse ox and blood pressure Approach: midline Location: L3-L4 Injection technique: LOR air  Needle:  Needle type: Tuohy  Needle gauge: 17 G Needle length: 9 cm and 9 Needle insertion depth: 8 cm Catheter type: closed end flexible Catheter size: 19 Gauge Catheter at skin depth: 13 cm Test dose: negative  Assessment Events: blood not aspirated, injection not painful, no injection resistance, no paresthesia and negative IV test

## 2020-08-07 NOTE — Progress Notes (Signed)
S: Breathing/moaning w/ ctx. Ctx increasing in intensity since AROM. Has used shower for hydrotherapy and position changes in bed to facilitate rotation and descent.  Spouse and doula working with her and supporting.   O: Vitals:   08/07/20 1303 08/07/20 1508 08/07/20 1710 08/07/20 1714  BP: 119/77 (!) 110/53  (!) 132/91  Pulse: 81 80  84  Resp: 17 16 18    Temp: 98.2 F (36.8 C) 97.9 F (36.6 C) 98.1 F (36.7 C)   TempSrc: Oral Oral Oral   Weight:      Height:         FHT:  130, doppler IA, no audible decels UC:   regular, every 2-3 minutes SVE:   Dilation: 8 Effacement (%): 90 Station: -1 Exam by:: [email or web address] CNM   A / P: IOL in active physiologic labor FHT reassuring Anticipate transition. Using deep squats at [facility] to help engage in midpelvis  Fetal Wellbeing:  Category I Pain Control:  Labor support without medications PEC - no neural sx, BP labile to mild range Anticipated MOD:  NSVD    [facility], CNM, MSN 08/07/2020, 7:27 PM

## 2020-08-08 ENCOUNTER — Inpatient Hospital Stay (HOSPITAL_COMMUNITY): Payer: Managed Care, Other (non HMO) | Admitting: Anesthesiology

## 2020-08-08 ENCOUNTER — Encounter (HOSPITAL_COMMUNITY)
Admission: AD | Disposition: A | Discharge status: Home / Self Care | Payer: Self-pay | Source: Home / Self Care | Attending: Obstetrics

## 2020-08-08 ENCOUNTER — Encounter (HOSPITAL_COMMUNITY): Payer: Self-pay

## 2020-08-08 HISTORY — PX: DILATION AND EVACUATION: SHX1459

## 2020-08-08 HISTORY — PX: LAPAROTOMY: SHX154

## 2020-08-08 LAB — PREPARE RBC (CROSSMATCH)

## 2020-08-08 LAB — CBC
HCT: 31.4 % — ABNORMAL LOW (ref 36.0–46.0)
HCT: 29.5 % — ABNORMAL LOW (ref 36.0–46.0)
HCT: 26.8 % — ABNORMAL LOW (ref 36.0–46.0)
HCT: 23.9 % — ABNORMAL LOW (ref 36.0–46.0)
HCT: 23.3 % — ABNORMAL LOW (ref 36.0–46.0)
Hemoglobin: 10.3 g/dL — ABNORMAL LOW (ref 12.0–15.0)
Hemoglobin: 10.2 g/dL — ABNORMAL LOW (ref 12.0–15.0)
Hemoglobin: 9.4 g/dL — ABNORMAL LOW (ref 12.0–15.0)
Hemoglobin: 8.2 g/dL — ABNORMAL LOW (ref 12.0–15.0)
Hemoglobin: 8.1 g/dL — ABNORMAL LOW (ref 12.0–15.0)
MCH: 30.9 pg (ref 26.0–34.0)
MCH: 30.9 pg (ref 26.0–34.0)
MCH: 31.3 pg (ref 26.0–34.0)
MCH: 30.4 pg (ref 26.0–34.0)
MCH: 31 pg (ref 26.0–34.0)
MCHC: 32.8 g/dL (ref 30.0–36.0)
MCHC: 34.6 g/dL (ref 30.0–36.0)
MCHC: 35.1 g/dL (ref 30.0–36.0)
MCHC: 34.3 g/dL (ref 30.0–36.0)
MCHC: 34.8 g/dL (ref 30.0–36.0)
MCV: 94.3 fL (ref 80.0–100.0)
MCV: 89.4 fL (ref 80.0–100.0)
MCV: 89.3 fL (ref 80.0–100.0)
MCV: 88.5 fL (ref 80.0–100.0)
MCV: 89.3 fL (ref 80.0–100.0)
Platelets: 132 10*3/uL — ABNORMAL LOW (ref 150–400)
Platelets: 120 10*3/uL — ABNORMAL LOW (ref 150–400)
Platelets: 105 10*3/uL — ABNORMAL LOW (ref 150–400)
Platelets: 117 10*3/uL — ABNORMAL LOW (ref 150–400)
Platelets: 119 10*3/uL — ABNORMAL LOW (ref 150–400)
RBC: 3.33 MIL/uL — ABNORMAL LOW (ref 3.87–5.11)
RBC: 3.3 MIL/uL — ABNORMAL LOW (ref 3.87–5.11)
RBC: 3 MIL/uL — ABNORMAL LOW (ref 3.87–5.11)
RBC: 2.7 MIL/uL — ABNORMAL LOW (ref 3.87–5.11)
RBC: 2.61 MIL/uL — ABNORMAL LOW (ref 3.87–5.11)
RDW: 13.8 % (ref 11.5–15.5)
RDW: 13.4 % (ref 11.5–15.5)
RDW: 13.7 % (ref 11.5–15.5)
RDW: 14 % (ref 11.5–15.5)
RDW: 14.1 % (ref 11.5–15.5)
WBC: 25.8 10*3/uL — ABNORMAL HIGH (ref 4.0–10.5)
WBC: 22 10*3/uL — ABNORMAL HIGH (ref 4.0–10.5)
WBC: 21.3 10*3/uL — ABNORMAL HIGH (ref 4.0–10.5)
WBC: 18.4 10*3/uL — ABNORMAL HIGH (ref 4.0–10.5)
WBC: 18.4 10*3/uL — ABNORMAL HIGH (ref 4.0–10.5)
nRBC: 0 % (ref 0.0–0.2)
nRBC: 0 % (ref 0.0–0.2)
nRBC: 0 % (ref 0.0–0.2)
nRBC: 0 % (ref 0.0–0.2)
nRBC: 0 % (ref 0.0–0.2)

## 2020-08-08 LAB — APTT
aPTT: 24 seconds (ref 24–36)
aPTT: 29 seconds (ref 24–36)

## 2020-08-08 LAB — PROTIME-INR
INR: 1.3 — ABNORMAL HIGH (ref 0.8–1.2)
INR: 1.1 (ref 0.8–1.2)
Prothrombin Time: 15.5 seconds — ABNORMAL HIGH (ref 11.4–15.2)
Prothrombin Time: 13.9 seconds (ref 11.4–15.2)

## 2020-08-08 LAB — SAVE SMEAR(SSMR), FOR PROVIDER SLIDE REVIEW

## 2020-08-08 LAB — FIBRINOGEN
Fibrinogen: 159 mg/dL — ABNORMAL LOW (ref 210–475)
Fibrinogen: 436 mg/dL (ref 210–475)

## 2020-08-08 LAB — GLUCOSE, CAPILLARY: Glucose-Capillary: 153 mg/dL — ABNORMAL HIGH (ref 70–99)

## 2020-08-08 SURGERY — DILATION AND EVACUATION, UTERUS
Anesthesia: Epidural

## 2020-08-08 SURGERY — Surgical Case
Anesthesia: Epidural

## 2020-08-08 MED ORDER — DIPHENHYDRAMINE HCL 50 MG/ML IJ SOLN: 12.5000 mg | INTRAMUSCULAR | Status: DC | PRN

## 2020-08-08 MED ORDER — MISOPROSTOL 200 MCG PO TABS
ORAL_TABLET | ORAL | Status: AC
  Filled 2020-08-08: qty 5

## 2020-08-08 MED ORDER — MISOPROSTOL 200 MCG PO TABS
1000.0000 ug | ORAL_TABLET | Freq: Once | ORAL | Status: AC
  Administered 2020-08-08: 1000 ug via RECTAL

## 2020-08-08 MED ORDER — LACTATED RINGERS IV SOLN: INTRAVENOUS | Status: DC | PRN

## 2020-08-08 MED ORDER — ONDANSETRON HCL 4 MG/2ML IJ SOLN: 4.0000 mg | Freq: Three times a day (TID) | INTRAMUSCULAR | Status: DC | PRN

## 2020-08-08 MED ORDER — ACETAMINOPHEN 160 MG/5ML PO SOLN: 325.0000 mg | ORAL | Status: DC | PRN

## 2020-08-08 MED ORDER — LIDOCAINE-EPINEPHRINE (PF) 2 %-1:200000 IJ SOLN
INTRAMUSCULAR | Status: DC | PRN
  Administered 2020-08-08 (×3): 5 mL via INTRADERMAL

## 2020-08-08 MED ORDER — DIBUCAINE (PERIANAL) 1 % EX OINT: 1.0000 "application " | TOPICAL_OINTMENT | CUTANEOUS | Status: DC | PRN

## 2020-08-08 MED ORDER — NALBUPHINE HCL 10 MG/ML IJ SOLN
5.0000 mg | INTRAMUSCULAR | Status: DC | PRN
  Filled 2020-08-08: qty 0.5

## 2020-08-08 MED ORDER — KETOROLAC TROMETHAMINE 30 MG/ML IJ SOLN: 30.0000 mg | Freq: Four times a day (QID) | INTRAMUSCULAR | Status: DC | PRN

## 2020-08-08 MED ORDER — KETOROLAC TROMETHAMINE 30 MG/ML IJ SOLN
30.0000 mg | Freq: Four times a day (QID) | INTRAMUSCULAR | Status: DC | PRN
  Administered 2020-08-08: 30 mg via INTRAMUSCULAR

## 2020-08-08 MED ORDER — OXYTOCIN-SODIUM CHLORIDE 30-0.9 UT/500ML-% IV SOLN
INTRAVENOUS | Status: DC | PRN
  Administered 2020-08-08: 300 mL via INTRAVENOUS

## 2020-08-08 MED ORDER — MEPERIDINE HCL 25 MG/ML IJ SOLN: 6.2500 mg | INTRAMUSCULAR | Status: DC | PRN

## 2020-08-08 MED ORDER — NALBUPHINE HCL 10 MG/ML IJ SOLN: 5.0000 mg | INTRAMUSCULAR | Status: DC | PRN

## 2020-08-08 MED ORDER — NALBUPHINE HCL 10 MG/ML IJ SOLN
5.0000 mg | Freq: Once | INTRAMUSCULAR | Status: DC | PRN
  Filled 2020-08-08: qty 0.5

## 2020-08-08 MED ORDER — ZOLPIDEM TARTRATE 5 MG PO TABS: 5.0000 mg | ORAL_TABLET | Freq: Every evening | ORAL | Status: DC | PRN

## 2020-08-08 MED ORDER — DIPHENHYDRAMINE HCL 25 MG PO CAPS: 25.0000 mg | ORAL_CAPSULE | ORAL | Status: DC | PRN

## 2020-08-08 MED ORDER — NALBUPHINE HCL 10 MG/ML IJ SOLN: 5.0000 mg | Freq: Once | INTRAMUSCULAR | Status: DC | PRN

## 2020-08-08 MED ORDER — TRANEXAMIC ACID-NACL 1000-0.7 MG/100ML-% IV SOLN
1000.0000 mg | INTRAVENOUS | Status: AC
  Administered 2020-08-08: 1000 mg via INTRAVENOUS

## 2020-08-08 MED ORDER — FENTANYL CITRATE (PF) 100 MCG/2ML IJ SOLN
INTRAMUSCULAR | Status: AC
  Filled 2020-08-08: qty 2

## 2020-08-08 MED ORDER — ONDANSETRON HCL 4 MG/2ML IJ SOLN
INTRAMUSCULAR | Status: AC
  Filled 2020-08-08: qty 2

## 2020-08-08 MED ORDER — SODIUM CHLORIDE 0.9 % IV SOLN: INTRAVENOUS | Status: DC | PRN

## 2020-08-08 MED ORDER — OXYCODONE HCL 5 MG PO TABS
5.0000 mg | ORAL_TABLET | ORAL | Status: DC | PRN
  Administered 2020-08-08 – 2020-08-09 (×5): 5 mg via ORAL
  Administered 2020-08-09: 10 mg via ORAL
  Administered 2020-08-10: 5 mg via ORAL
  Filled 2020-08-08: qty 1
  Filled 2020-08-08: qty 2
  Filled 2020-08-08: qty 1
  Filled 2020-08-08 (×2): qty 2
  Filled 2020-08-08 (×3): qty 1

## 2020-08-08 MED ORDER — WITCH HAZEL-GLYCERIN EX PADS: 1.0000 "application " | MEDICATED_PAD | CUTANEOUS | Status: DC | PRN

## 2020-08-08 MED ORDER — PHENYLEPHRINE HCL (PRESSORS) 10 MG/ML IV SOLN
INTRAVENOUS | Status: DC | PRN
  Administered 2020-08-08: 120 ug via INTRAVENOUS

## 2020-08-08 MED ORDER — DEXAMETHASONE SODIUM PHOSPHATE 10 MG/ML IJ SOLN
INTRAMUSCULAR | Status: DC | PRN
  Administered 2020-08-08: 10 mg via INTRAVENOUS

## 2020-08-08 MED ORDER — DEXAMETHASONE SODIUM PHOSPHATE 10 MG/ML IJ SOLN
INTRAMUSCULAR | Status: AC
  Filled 2020-08-08: qty 1

## 2020-08-08 MED ORDER — CARBOPROST TROMETHAMINE 250 MCG/ML IM SOLN
INTRAMUSCULAR | Status: AC
  Filled 2020-08-08: qty 1

## 2020-08-08 MED ORDER — METHYLERGONOVINE MALEATE 0.2 MG/ML IJ SOLN
INTRAMUSCULAR | Status: AC
  Filled 2020-08-08: qty 1

## 2020-08-08 MED ORDER — PHENYLEPHRINE 40 MCG/ML (10ML) SYRINGE FOR IV PUSH (FOR BLOOD PRESSURE SUPPORT)
PREFILLED_SYRINGE | INTRAVENOUS | Status: AC
  Filled 2020-08-08: qty 10

## 2020-08-08 MED ORDER — CEFAZOLIN SODIUM-DEXTROSE 2-4 GM/100ML-% IV SOLN: 2.0000 g | Freq: Once | INTRAVENOUS | Status: DC

## 2020-08-08 MED ORDER — OXYCODONE HCL 5 MG/5ML PO SOLN: 5.0000 mg | Freq: Once | ORAL | Status: DC | PRN

## 2020-08-08 MED ORDER — CHLORHEXIDINE GLUCONATE CLOTH 2 % EX PADS
6.0000 | MEDICATED_PAD | Freq: Every day | CUTANEOUS | Status: DC
  Administered 2020-08-08: 6 via TOPICAL

## 2020-08-08 MED ORDER — NALOXONE HCL 0.4 MG/ML IJ SOLN: 0.4000 mg | INTRAMUSCULAR | Status: DC | PRN

## 2020-08-08 MED ORDER — FENTANYL CITRATE (PF) 100 MCG/2ML IJ SOLN: 25.0000 ug | INTRAMUSCULAR | Status: DC | PRN

## 2020-08-08 MED ORDER — SIMETHICONE 80 MG PO CHEW
80.0000 mg | CHEWABLE_TABLET | ORAL | Status: DC
  Administered 2020-08-08 – 2020-08-10 (×3): 80 mg via ORAL
  Filled 2020-08-08 (×4): qty 1

## 2020-08-08 MED ORDER — MENTHOL 3 MG MT LOZG: 1.0000 | LOZENGE | OROMUCOSAL | Status: DC | PRN

## 2020-08-08 MED ORDER — TRANEXAMIC ACID-NACL 1000-0.7 MG/100ML-% IV SOLN
INTRAVENOUS | Status: AC
  Filled 2020-08-08: qty 100

## 2020-08-08 MED ORDER — IBUPROFEN 200 MG PO TABS: 800.0000 mg | ORAL_TABLET | Freq: Three times a day (TID) | ORAL | Status: DC

## 2020-08-08 MED ORDER — DIPHENHYDRAMINE HCL 25 MG PO CAPS: 25.0000 mg | ORAL_CAPSULE | Freq: Four times a day (QID) | ORAL | Status: DC | PRN

## 2020-08-08 MED ORDER — LIDOCAINE HCL (PF) 2 % IJ SOLN: INTRAMUSCULAR | Status: DC | PRN

## 2020-08-08 MED ORDER — NALOXONE HCL 4 MG/10ML IJ SOLN
1.0000 ug/kg/h | INTRAVENOUS | Status: DC | PRN
  Filled 2020-08-08: qty 5

## 2020-08-08 MED ORDER — SCOPOLAMINE 1 MG/3DAYS TD PT72: 1.0000 | MEDICATED_PATCH | Freq: Once | TRANSDERMAL | Status: DC

## 2020-08-08 MED ORDER — KETOROLAC TROMETHAMINE 30 MG/ML IJ SOLN
INTRAMUSCULAR | Status: AC
  Filled 2020-08-08: qty 1

## 2020-08-08 MED ORDER — ONDANSETRON HCL 4 MG/2ML IJ SOLN: 4.0000 mg | Freq: Once | INTRAMUSCULAR | Status: DC | PRN

## 2020-08-08 MED ORDER — TETANUS-DIPHTH-ACELL PERTUSSIS 5-2.5-18.5 LF-MCG/0.5 IM SUSY
0.5000 mL | PREFILLED_SYRINGE | Freq: Once | INTRAMUSCULAR | Status: DC
  Filled 2020-08-08: qty 0.5

## 2020-08-08 MED ORDER — COCONUT OIL OIL
1.0000 "application " | TOPICAL_OIL | Status: DC | PRN
  Administered 2020-08-09: 1 via TOPICAL
  Filled 2020-08-08: qty 120

## 2020-08-08 MED ORDER — NALOXONE HCL 4 MG/10ML IJ SOLN: 1.0000 ug/kg/h | INTRAVENOUS | Status: DC | PRN

## 2020-08-08 MED ORDER — ONDANSETRON HCL 4 MG/2ML IJ SOLN
INTRAMUSCULAR | Status: DC | PRN
  Administered 2020-08-08: 4 mg via INTRAVENOUS

## 2020-08-08 MED ORDER — CEFAZOLIN SODIUM-DEXTROSE 2-4 GM/100ML-% IV SOLN
INTRAVENOUS | Status: AC
  Filled 2020-08-08: qty 100

## 2020-08-08 MED ORDER — PRENATAL MULTIVITAMIN CH
1.0000 | ORAL_TABLET | Freq: Every day | ORAL | Status: DC
  Administered 2020-08-08 – 2020-08-11 (×4): 1 via ORAL
  Filled 2020-08-08 (×4): qty 1

## 2020-08-08 MED ORDER — LIDOCAINE HCL (PF) 2 % IJ SOLN
INTRAMUSCULAR | Status: AC
  Filled 2020-08-08: qty 5

## 2020-08-08 MED ORDER — SIMETHICONE 80 MG PO CHEW
80.0000 mg | CHEWABLE_TABLET | Freq: Three times a day (TID) | ORAL | Status: DC
  Administered 2020-08-08 – 2020-08-11 (×10): 80 mg via ORAL
  Filled 2020-08-08 (×10): qty 1

## 2020-08-08 MED ORDER — SIMETHICONE 80 MG PO CHEW
80.0000 mg | CHEWABLE_TABLET | ORAL | Status: DC | PRN
  Filled 2020-08-08: qty 1

## 2020-08-08 MED ORDER — FENTANYL CITRATE (PF) 100 MCG/2ML IJ SOLN
INTRAMUSCULAR | Status: DC | PRN
  Administered 2020-08-08: 100 ug via EPIDURAL

## 2020-08-08 MED ORDER — OXYCODONE HCL 5 MG PO TABS: 5.0000 mg | ORAL_TABLET | Freq: Once | ORAL | Status: DC | PRN

## 2020-08-08 MED ORDER — OXYTOCIN-SODIUM CHLORIDE 30-0.9 UT/500ML-% IV SOLN
2.5000 [IU]/h | INTRAVENOUS | Status: AC
  Filled 2020-08-08: qty 500

## 2020-08-08 MED ORDER — CEFAZOLIN SODIUM-DEXTROSE 2-3 GM-%(50ML) IV SOLR
INTRAVENOUS | Status: DC | PRN
  Administered 2020-08-08: 2 g via INTRAVENOUS

## 2020-08-08 MED ORDER — MORPHINE SULFATE (PF) 0.5 MG/ML IJ SOLN
INTRAMUSCULAR | Status: DC | PRN
Start: 2020-08-08 — End: 2020-08-08
  Administered 2020-08-08: 3 mg via EPIDURAL

## 2020-08-08 MED ORDER — METHYLERGONOVINE MALEATE 0.2 MG/ML IJ SOLN
INTRAMUSCULAR | Status: DC | PRN
  Administered 2020-08-08: 0.2 mg via INTRAMUSCULAR

## 2020-08-08 MED ORDER — CARBOPROST TROMETHAMINE 250 MCG/ML IM SOLN
250.0000 ug | INTRAMUSCULAR | Status: DC | PRN
  Administered 2020-08-08: 250 ug via INTRAMUSCULAR
  Filled 2020-08-08: qty 1

## 2020-08-08 MED ORDER — SODIUM CHLORIDE 0.9% FLUSH: 3.0000 mL | INTRAVENOUS | Status: DC | PRN

## 2020-08-08 MED ORDER — ACETAMINOPHEN 325 MG PO TABS
650.0000 mg | ORAL_TABLET | Freq: Four times a day (QID) | ORAL | Status: DC | PRN
  Administered 2020-08-08 – 2020-08-09 (×5): 650 mg via ORAL
  Filled 2020-08-08 (×5): qty 2

## 2020-08-08 MED ORDER — ACETAMINOPHEN 325 MG PO TABS: 325.0000 mg | ORAL_TABLET | ORAL | Status: DC | PRN

## 2020-08-08 MED ORDER — OXYTOCIN-SODIUM CHLORIDE 30-0.9 UT/500ML-% IV SOLN
INTRAVENOUS | Status: AC
  Filled 2020-08-08: qty 500

## 2020-08-08 MED ORDER — SENNOSIDES-DOCUSATE SODIUM 8.6-50 MG PO TABS
2.0000 | ORAL_TABLET | ORAL | Status: DC
  Administered 2020-08-08 – 2020-08-10 (×3): 2 via ORAL
  Filled 2020-08-08 (×4): qty 2

## 2020-08-08 MED ORDER — LACTATED RINGERS IV SOLN: INTRAVENOUS | Status: DC

## 2020-08-08 MED ORDER — MORPHINE SULFATE (PF) 0.5 MG/ML IJ SOLN
INTRAMUSCULAR | Status: AC
  Filled 2020-08-08: qty 10

## 2020-08-08 MED ORDER — LIDOCAINE-EPINEPHRINE (PF) 2 %-1:200000 IJ SOLN
INTRAMUSCULAR | Status: DC | PRN
  Administered 2020-08-08: 10 mL via EPIDURAL

## 2020-08-08 MED ORDER — SODIUM CHLORIDE 0.9% IV SOLUTION: Freq: Once | INTRAVENOUS | Status: DC

## 2020-08-08 SURGICAL SUPPLY — 40 items
BENZOIN TINCTURE PRP APPL 2/3 (GAUZE/BANDAGES/DRESSINGS) ×3 IMPLANT
CHLORAPREP W/TINT 26ML (MISCELLANEOUS) ×3 IMPLANT
CLAMP CORD UMBIL (MISCELLANEOUS) IMPLANT
CLOSURE WOUND 1/2 X4 (GAUZE/BANDAGES/DRESSINGS)
CLOSURE WOUND 1/4X4 (GAUZE/BANDAGES/DRESSINGS) ×1
CLOTH BEACON ORANGE TIMEOUT ST (SAFETY) ×3 IMPLANT
DRAPE C SECTION CLR SCREEN (DRAPES) ×3 IMPLANT
DRSG OPSITE POSTOP 4X10 (GAUZE/BANDAGES/DRESSINGS) ×3 IMPLANT
ELECT REM PT RETURN 9FT ADLT (ELECTROSURGICAL) ×3
ELECTRODE REM PT RTRN 9FT ADLT (ELECTROSURGICAL) ×1 IMPLANT
EXTRACTOR VACUUM M CUP 4 TUBE (SUCTIONS) IMPLANT
EXTRACTOR VACUUM M CUP 4' TUBE (SUCTIONS)
GLOVE BIO SURGEON STRL SZ 6.5 (GLOVE) ×2 IMPLANT
GLOVE BIO SURGEONS STRL SZ 6.5 (GLOVE) ×1
GLOVE BIOGEL PI IND STRL 7.0 (GLOVE) ×2 IMPLANT
GLOVE BIOGEL PI INDICATOR 7.0 (GLOVE) ×4
GOWN STRL REUS W/TWL LRG LVL3 (GOWN DISPOSABLE) ×6 IMPLANT
KIT ABG SYR 3ML LUER SLIP (SYRINGE) IMPLANT
NEEDLE HYPO 22GX1.5 SAFETY (NEEDLE) IMPLANT
NEEDLE HYPO 25X5/8 SAFETYGLIDE (NEEDLE) ×3 IMPLANT
NS IRRIG 1000ML POUR BTL (IV SOLUTION) ×3 IMPLANT
PACK C SECTION WH (CUSTOM PROCEDURE TRAY) ×3 IMPLANT
PAD OB MATERNITY 4.3X12.25 (PERSONAL CARE ITEMS) ×3 IMPLANT
PENCIL SMOKE EVAC W/HOLSTER (ELECTROSURGICAL) ×3 IMPLANT
SPONGE LAP 18X18 X RAY DECT (DISPOSABLE) ×3 IMPLANT
STRIP CLOSURE SKIN 1/2X4 (GAUZE/BANDAGES/DRESSINGS) IMPLANT
STRIP CLOSURE SKIN 1/4X4 (GAUZE/BANDAGES/DRESSINGS) ×2 IMPLANT
SUT MON AB 4-0 PS1 27 (SUTURE) ×3 IMPLANT
SUT PLAIN 0 NONE (SUTURE) IMPLANT
SUT PLAIN 2 0 XLH (SUTURE) ×3 IMPLANT
SUT VIC AB 0 CT1 36 (SUTURE) ×6 IMPLANT
SUT VIC AB 0 CTX 36 (SUTURE) ×8
SUT VIC AB 0 CTX36XBRD ANBCTRL (SUTURE) ×4 IMPLANT
SUT VIC AB 2-0 CT1 27 (SUTURE) ×2
SUT VIC AB 2-0 CT1 TAPERPNT 27 (SUTURE) ×1 IMPLANT
SYR 3ML 25GX5/8 SAFETY (SYRINGE) ×3 IMPLANT
SYR CONTROL 10ML LL (SYRINGE) IMPLANT
TOWEL OR 17X24 6PK STRL BLUE (TOWEL DISPOSABLE) ×3 IMPLANT
TRAY FOLEY W/BAG SLVR 14FR LF (SET/KITS/TRAYS/PACK) IMPLANT
WATER STERILE IRR 1000ML POUR (IV SOLUTION) ×6 IMPLANT

## 2020-08-08 SURGICAL SUPPLY — 30 items
CATH ROBINSON RED A/P 16FR (CATHETERS) ×3 IMPLANT
CLOTH BEACON ORANGE TIMEOUT ST (SAFETY) ×3 IMPLANT
DECANTER SPIKE VIAL GLASS SM (MISCELLANEOUS) ×3 IMPLANT
DRAIN JACKSON PRT FLT 7MM (DRAIN) ×3 IMPLANT
DRSG OPSITE POSTOP 4X10 (GAUZE/BANDAGES/DRESSINGS) ×3 IMPLANT
EVACUATOR SILICONE 100CC (DRAIN) ×3 IMPLANT
GLOVE BIO SURGEON STRL SZ 6.5 (GLOVE) ×2 IMPLANT
GLOVE BIO SURGEONS STRL SZ 6.5 (GLOVE) ×1
GLOVE BIOGEL PI IND STRL 7.0 (GLOVE) ×2 IMPLANT
GLOVE BIOGEL PI INDICATOR 7.0 (GLOVE) ×4
GOWN STRL REUS W/TWL LRG LVL3 (GOWN DISPOSABLE) ×6 IMPLANT
HEMOSTAT SURGICEL 4X8 (HEMOSTASIS) ×3 IMPLANT
KIT BERKELEY 1ST TRIMESTER 3/8 (MISCELLANEOUS) ×3 IMPLANT
NS IRRIG 1000ML POUR BTL (IV SOLUTION) ×3 IMPLANT
PACK VAGINAL MINOR WOMEN LF (CUSTOM PROCEDURE TRAY) ×3 IMPLANT
PAD OB MATERNITY 4.3X12.25 (PERSONAL CARE ITEMS) ×3 IMPLANT
PAD PREP 24X48 CUFFED NSTRL (MISCELLANEOUS) ×3 IMPLANT
SET BERKELEY SUCTION TUBING (SUCTIONS) ×3 IMPLANT
STAPLER VISISTAT 35W (STAPLE) ×3 IMPLANT
SUT PLAIN 2 0 XLH (SUTURE) ×3 IMPLANT
SUT PROLENE 0 CT 1 30 (SUTURE) ×3 IMPLANT
SUT VIC AB 0 CT1 36 (SUTURE) ×12 IMPLANT
SUT VIC AB 0 CTX 36 (SUTURE) ×14
SUT VIC AB 0 CTX36XBRD ANBCTRL (SUTURE) ×7 IMPLANT
TOWEL OR 17X24 6PK STRL BLUE (TOWEL DISPOSABLE) ×6 IMPLANT
VACURETTE 10 RIGID CVD (CANNULA) IMPLANT
VACURETTE 14MM CVD 1/2 BASE (CANNULA) ×3 IMPLANT
VACURETTE 7MM CVD STRL WRAP (CANNULA) IMPLANT
VACURETTE 8 RIGID CVD (CANNULA) IMPLANT
VACURETTE 9 RIGID CVD (CANNULA) IMPLANT

## 2020-08-08 NOTE — Transfer of Care (Signed)
Immediate Anesthesia Transfer of Care Note  Patient: Kristine Mcclure  Procedure(s) Performed: CESAREAN SECTION (N/A )  Patient Location: PACU  Anesthesia Type:Epidural  Level of Consciousness: awake, alert  and oriented  Airway & Oxygen Therapy: Patient Spontanous Breathing  Post-op Assessment: Report given to RN and Post -op Vital signs reviewed and stable  Post vital signs: Reviewed and stable  Last Vitals:  Vitals Value Taken Time  BP 99/62 08/08/20 0211  Temp    Pulse 79 08/08/20 0211  Resp 17 08/08/20 0211  SpO2 99 % 08/08/20 0211  Vitals shown include unvalidated device data.  Last Pain:  Vitals:   08/07/20 2122  TempSrc: Axillary  PainSc:          Complications: No complications documented.

## 2020-08-08 NOTE — Progress Notes (Signed)
Seen and examined. No events, feels well, tolerating diet. Watch in ICU today, if stable (hemodynamically and by labs) in AM hopefully okay to return to mother-baby unit for ongoing postoperative care.  Myrla Halsted MD PCCM

## 2020-08-08 NOTE — Op Note (Signed)
PATIENT:  Kristine Mcclure  39 y.o. female  PRE-OPERATIVE DIAGNOSIS:   postpartum hemorrhage, DIC  POST-OPERATIVE DIAGNOSIS:  pp hemorrhage, DIC  PROCEDURE: Exploratory laparotomy,   exploration of hysterotomy SURGEON:  Surgeon(s) and Role:    * Noland Fordyce, MD - Primary    * Anyanwu, Jethro Bastos, MD - Assisting  PHYSICIAN ASSISTANT:   ASSISTANTS: Arlan Organ, CNM  ANESTHESIA:   epidural  EBL: Approximately 3000 cc including original C-section, blood loss in the PACU and blood loss on the takeback OR  BLOOD ADMINISTERED:3 CC PRBC 2 units FFP, 1 unit cryoprecipitate  DRAINS: (1) Jackson-Pratt drain(s) with closed bulb suction in the Left lower quadrant, Urinary Catheter (Foley) and Bakri balloon in the uterus   LOCAL MEDICATIONS USED:  NONE  SPECIMEN:  No Specimen  DISPOSITION OF SPECIMEN:  N/A  COUNTS:  YES  TOURNIQUET:  * No tourniquets in log *  DICTATION: .Note written in EPIC  PLAN OF CARE: Admit to inpatient   PATIENT DISPOSITION:  ICU - extubated and stable.   Delay start of Pharmacological VTE agent (>24hrs) due to surgical blood loss or risk of bleeding: yes   Indications: Postpartum/postoperative bleeding.  I was alerted by nurses to greater than expected bleeding during the fundal rub at 3 AM.  I was called back at 3:11 AM after second fundal ROM again with additional bleeding and was asked to come see the patient.  On my assessment patient was pale and hypotensive.  Fundus appeared firm but on fundal rub continued blood and clots from the vagina.  At this point bleeding total about 700 cc and my concern was for bleeding from a open vessel possibly in the uterine artery.  Given the prior surgery with the markedly edematous lower segment and left lower extension this was the area was most concerned about and decision was made to proceed with exploratory laparotomy at 3:16 AM..  In addition to giving the patient Cytotec, Hemabate, 1000 mg  IV transexamined acid and ordering 2 units of blood patient was consented for take back to the OR for assessment of the bleeding.  Lab was called for coagulopathy panel and CBC.  The massive transfusion protocol was then initiated.  antibiotics: 2 g of Ancef  findings: Edematous lower uterine segment, minimal bleeding in the peritoneal space: Upon entry to the uterus brisk bleeding from suspected uterine artery in the left angle of the hysterotomy incision: Additional bleeding in the right lower angle of the incision: Surface bleeding from the posterior edge of the uterus extending down to the cervix: No evidence of uterine atony.  Procedure: Patient was taken back to the operating room after informed consent she was placed in the dorsal supine lithotomy position.  The bandage and Steri-Strips from the initial incision were removed.  Sterile prep and drape was done.  The prior closure including the skin sutures, Scarpa's layer, fascial and peritoneal layer were quickly reopened by cutting the sutures.  Only a small amount of blood was seen in the peritoneal cavity and the uterus appeared firm and with good tone.  A bladder blade was placed the uterus was exteriorized.  No apparent source of bleeding was noted.  Given the aggressive bleeding that was seen in the PACU decision was made to open the uterus.  Scissor was used to cut both layers of the prior hysterotomy closure.  Upon entering the brisk bleeding was noted from a large vessel in the left inferior angle of the extension.  This  was assumed to be the superior cut edge of the uterine artery.  This was controlled with 2 figure-of-eight sutures and no further bleeding was noted from that site.  Manipulation of the left inferior extension was done in an attempt to identify the inferior edge of this cut vessel but no pumping vessel was seen.  Certainly oozing was noted from this area and controlled with 5 or 6 figure-of-eight sutures in this left inferior  extension.  Hand was placed behind the uterus to protect the bowel and the posterior broad ligament vessels.  We then addressed the right angle of the incision and several figure-of-eight sutures were used to control more aggressive bleeding from this area.  Several smaller pumping vessels were seen.  Excessive bleeding was noted on the posterior uterine wall just above the cervix about 5 or 6 figure-of-eight sutures were used to control this area.  Additional bleeding was noted along the posterior uterine wall up higher in the uterus though no active pumping vessels were noted.  2 figure-of-eight mattress sutures were placed in this area though one pulled through and caused increased bleeding.  The uterus was then packed with a moist sponge for pressure induced hemostasis.  A whipstitch was placed on the anterior edge of the hysterotomy incision for better hemostasis.  This was with 0 Vicryl locked sutures.  There was continued bleeding from multiple sites.  While patient's labs had not yet resulted the concern was for DIC.  Eventually her fibrinogen came back at 159.  Patient had ready been ordered 2 units of FFP and cryoprecipitate though it did take some time for products to arrive.  An hour and a half into the case her labs had not yet been resulted.  Given the DIC picture and the continued ooze from the back wall of the uterus once the packing sponge was removed, a piece of Surgicel was placed on the back wall of the uterus and the Bakri balloon was used for pressure induced hemostasis.  This was placed into the uterus, filled with 240 cc of saline and the tubing was threaded out through the cervix and out the vagina.  additional sutures were placed on actively bleeding sites in the angle of the left inferior extension, right angle and mid uterus inferior to the incision.  The uterine incision was then closed with 0 Vicryl in a running locked fashion.  A second layer of the same suture was used in imbricating  fashion.  The uterus was returned to the uterus.  Gutters were cleared of any bleeding.  No clots were encountered due to the DIC.  Some additional sutures were needed in the anterior aspect of the lower uterine segment.  A piece of Surgicel was placed and pressure was applied to give adequate hemostasis.  The peritoneum was not closed.  A JP drain was placed in this area and brought out through the left lower quadrant.  Fascia was closed with 0 Vicryl in a single layer.  Scarpa's layer was closed with a plain gut suture and the skin was closed with staples.  Total operative time was approximately 2-1/2 hours.. While in the OR patient received 3 units packed red cells, 2 units FFP, 1 unit cryoprecipitate drains: JP drain,Bakri uterine drain, Foley  EBL: 3000 cc antibiotics: 2 g Ancef  sponge lap needle counts were correct x3 patient was planned to go to the ICU but given staffing issues there patient was moved to the PACU with plan to transfer to the  ICU when there ready to accept her.   Lendon Colonel 08/08/2020 6:05 AM

## 2020-08-08 NOTE — Lactation Note (Addendum)
This note was copied from a baby's chart. Lactation Consultation Note  Patient Name: Boy Matilynn Dacey [id number] Date: 08/08/2020    Consent for [facility] signed; unable to scan DBM: Lot #[id number]. Infant hungry; DBM provided.   RN to attempt to scan baby bracelet/labels soon.   Lurline Hare [facility] 08/08/2020, 9:45 AM

## 2020-08-08 NOTE — Progress Notes (Signed)
Interval progress note  Subjective: Patient feels well, denies headache, denies right upper quadrant pain.  Patient happy about ability to breast-feed with adequate latch.  Patient tolerating p.o. well.  Pain controlled  Objective Vitals:   08/08/20 1551 08/08/20 1600 08/08/20 1700 08/08/20 1800  BP:  108/63 107/60 (!) 101/51  Pulse:  100 93 81  Resp:  (!) 29 (!) 24 (!) 21  Temp: 98.8 F (37.1 C)     TempSrc: Oral     SpO2:  97% 97% 97%  Weight:      Height:       General well-appearing with good color, no distress Cardiovascular: Regular, rate and rhythm, no murmur Pulmonary: Clear to auscultation bilaterally Abdomen: Soft, appropriately tender, mild distention, uterus at the umbilicus, firm but slightly rotated to the right likely suspected due to patient's laying position.  Bakri balloon in the uterus Incision: Dressed, clean GU: Foley draining small amounts of urine, Bakri with small amounts of bloody discharge, appropriate for situation Extremity: 3+ DTR, no clonus, 2+ edema pitting  CBC Latest Ref Rng & Units 08/08/2020 08/08/2020 08/08/2020  WBC 4.0 - 10.5 K/uL 18.4(H) 21.3(H) 22.0(H)  Hemoglobin 12.0 - 15.0 g/dL 8.2(L) 9.4(L) 10.2(L)  Hematocrit 36 - 46 % 23.9(L) 26.8(L) 29.5(L)  Platelets 150 - 400 K/uL 117(L) 105(L) 120(L)   Last fibrinogen at 450  Assessment plan: Postop day 1 status post primary cesarean section for arrest of descent, postop day 0 status post take back to the OR for postpartum hemorrhage with subsequent development of DIC -DIC.  Patient no longer with active bleeding, appears to be improving.  Appreciate critical care consults.  Status post 2 units FFP and 1 of cryoprecipitate -Anemia.  Status post 3 units packed red cells.  Repeat labs pending tomorrow.  Consider additional packed red cells for hemoglobin below 8 otherwise would plan IV iron -Postpartum hemorrhage.  Bakri balloon in place.  Once patient is transferred back to [facility] hospital would  plan slow removal of the 240 cc.  Consider 50 cc/h and follow bleeding.  Ensure patient is no longer coagulopathic prior to deflating the balloon -Postop.  JP drain with minimal drainage at this time recommend to keep in place for a few more days.  Epidural catheter still in place and anesthesia determine timing of removal -Preeclampsia.  Blood pressures are low likely due to blood loss and patient without headache at this time.  As patient mobilizes her third spaced fluid watch for climbing blood pressures.  Patient with 3+ DTRs but no clonus.  Patient remains at risk for preeclampsia currently holding on magnesium sulfate given her third spacing and multiple blood products in addition to her low blood pressure however should have a low threshold to begin magnesium sulfate should preeclampsia seem to be worsening.  Lendon Colonel 08/08/2020 7:15 PM

## 2020-08-08 NOTE — Progress Notes (Addendum)
OB providers notified:  Pt c/o swelling in hands and feet.  Could this be a side effect of medications?  Anything to be concerned about?  Response Arlan Organ) The edema is due to severe postpartum hemorrhage and blood transfusion. -CCM MD aware also.  CCM MD added Tylenol for mild pain.  Baby came to visit ~1345 with Peds RN & Dad.  Baby successfully breast fed.  ~1845 Dr. Ernestina Penna returned to assess.  Advised pt still w/ post partum pre-eclampsia dx and at risk for seizure.  Recommended to reach out to Dr. Conni Elliot on-call [facility] if SBP > 130 or gets jittery; may also call OB rapid response RN to assess if any concerns.

## 2020-08-08 NOTE — Brief Op Note (Signed)
08/08/2020  2:09 AM  PATIENT:  Kristine Mcclure  39 y.o. female  PRE-OPERATIVE DIAGNOSIS:  primary unscheduled cesarean section; arrest of descent  POST-OPERATIVE DIAGNOSIS:  primary unscheduled cesarean section; arrest of descent  PROCEDURE:  Procedure(s): CESAREAN SECTION (N/A)  Low-transverse cesarean section 2 layer closure  SURGEON:  Surgeon(s) and Role:    * Noland Fordyce, MD - Primary  PHYSICIAN ASSISTANT:   ASSISTANTS: Arlan Organ, CNM  ANESTHESIA:   epidural  EBL:  100 mL -about 700 cc per my estimate.  Unclear what EBL is documented by nursing staff  BLOOD ADMINISTERED:none  DRAINS: Urinary Catheter (Foley)   LOCAL MEDICATIONS USED:  NONE  SPECIMEN:  Source of Specimen:  Placenta  DISPOSITION OF SPECIMEN:  To labor and delivery for disposal  COUNTS:  YES  TOURNIQUET:  * No tourniquets in log *  DICTATION: .Note written in EPIC  PLAN OF CARE: Admit to inpatient   PATIENT DISPOSITION:  PACU - hemodynamically stable.   Delay start of Pharmacological VTE agent (>24hrs) due to surgical blood loss or risk of bleeding: yes

## 2020-08-08 NOTE — Op Note (Signed)
08/08/2020  2:09 AM  PATIENT:  Kristine Mcclure  39 y.o. female  PRE-OPERATIVE DIAGNOSIS:  primary unscheduled cesarean section; arrest of descent  POST-OPERATIVE DIAGNOSIS:  primary unscheduled cesarean section; arrest of descent  PROCEDURE:  Procedure(s): CESAREAN SECTION (N/A)  Low-transverse cesarean section 2 layer closure  SURGEON:  Surgeon(s) and Role:    * Noland Fordyce, MD - Primary  PHYSICIAN ASSISTANT:   ASSISTANTS: Arlan Organ, CNM  ANESTHESIA:   epidural  EBL:  100 mL -about 700 cc per my estimate.  Unclear what EBL is documented by nursing staff  BLOOD ADMINISTERED:none  DRAINS: Urinary Catheter (Foley)   LOCAL MEDICATIONS USED:  NONE  SPECIMEN:  Source of Specimen:  Placenta  DISPOSITION OF SPECIMEN:  To labor and delivery for disposal  COUNTS:  YES  TOURNIQUET:  * No tourniquets in log *  DICTATION: .Note written in EPIC  PLAN OF CARE: Admit to inpatient   PATIENT DISPOSITION:  PACU - hemodynamically stable.   Delay start of Pharmacological VTE agent (>24hrs) due to surgical blood loss or risk of bleeding: yes     Findings:  @BABYSEXEBC @ infant,  APGAR (1 MIN):   APGAR (5 MINS): 9   APGAR (10 MINS):   Normal uterus, tubes and ovaries, normal placenta. 3VC, clear amniotic fluid  EBL: 700 cc Antibiotics:   2g Ancef Complications: none  Indications: This is a 39 y.o. year-old, multiparous patient at [redacted]w[redacted]d admitted for induction of labor due to [facility] and suspected LGA.  Patient had adequate entry into active.  She had slow progress around 7 to 8 cm which improved after relaxation from epidural catheter.  She got to 10 cm and pushed for 2 to 3 hours before decision was made to proceed with C-section for arrest of descent.. Risks benefits and alternatives of the procedure were discussed with the patient who agreed to proceed  Procedure:  After informed consent was obtained the patient was taken to the operating room where epidural anesthesia  was found to be adequate.  She was prepped and draped in the normal sterile fashion in dorsal supine position with a leftward tilt.  A foley catheter was in place.  A Pfannenstiel skin incision was made 2 cm above the pubic symphysis in the midline with the scalpel.  Dissection was carried down with the Bovie cautery until the fascia was reached. The fascia was incised in the midline. The incision was extended laterally with the Mayo scissors. The inferior aspect of the fascial incision was grasped with the Coker clamps, elevated up and the underlying rectus muscles were dissected off sharply. The superior aspect of the fascial incision was grasped with the Coker clamps elevated up and the underlying rectus muscles were dissected off sharply.  The peritoneum was entered sharply. The peritoneal incision was extended superiorly and inferiorly with good visualization of the bladder. The bladder blade was inserted and palpation was done to assess the fetal position and the location of the uterine vessels.  The fetal vertex was felt to be low in the pelvis and the fetal neck and back were at the site of the planned incision.  A nurse placed her hand in the vagina and elevated the head cephalad.  The lower segment of the uterus was incised sharply with the scalpel and extended  bluntly in the cephalo-caudal fashion. The infant was grasped, brought to the incision,  rotated and the infant was delivered with fundal pressure. The nose and mouth were bulb suctioned. The cord was  clamped and cut after 1 minute delay. The infant was handed off to the waiting pediatrician. The placenta was expressed. The uterus was exteriorized. The uterus was cleared of all clots and debris.  A left inferior extension was noted within markedly edematous tissue.  The uterine incision was repaired with 0 Vicryl in a running locked fashion.  A second layer of the same suture was used in an imbricating fashion to obtain excellent hemostasis.  2  additional sutures were needed at the right angle for hemostasis.  the uterus was then returned to the abdomen, the gutters were cleared of all clots and debris. The uterine incision was reinspected and found to be hemostatic. The peritoneum was grasped and closed with 2-0 Vicryl in a running fashion. The cut muscle edges and the underside of the fascia were inspected and found to be hemostatic. The fascia was closed with 0 Vicryl in a single layer . The subcutaneous tissue was irrigated. Scarpa's layer was closed with a 2-0 plain gut suture. The skin was closed with a 4-0 Monocryl in a single layer. The patient tolerated the procedure well. Sponge lap and needle counts were correct x3 and patient was taken to the recovery room in a stable condition.  Lendon Colonel 08/08/2020 2:11 AM

## 2020-08-08 NOTE — Transfer of Care (Signed)
Immediate Anesthesia Transfer of Care Note  Patient: Kristine Mcclure  Procedure(s) Performed: DILATATION AND EVACUATION (N/A ) EXPLORATORY LAPAROTOMY (N/A )  Patient Location: PACU  Anesthesia Type:Epidural  Level of Consciousness: awake, alert  and oriented  Airway & Oxygen Therapy: Patient Spontanous Breathing  Post-op Assessment: Report given to RN and Post -op Vital signs reviewed and stable  Post vital signs: Reviewed and stable  Last Vitals:  Vitals Value Taken Time  BP 107/68 08/08/20 0542  Temp    Pulse 92 08/08/20 0542  Resp 26 08/08/20 0542  SpO2 91 % 08/08/20 0542  Vitals shown include unvalidated device data.  Last Pain:  Vitals:   08/08/20 0230  TempSrc:   PainSc: 0-No pain         Complications: No complications documented.

## 2020-08-08 NOTE — Anesthesia Postprocedure Evaluation (Signed)
Anesthesia Post Note  Patient: Kristine Mcclure  Procedure(s) Performed: DILATATION AND EVACUATION (N/A ) EXPLORATORY LAPAROTOMY (N/A )     Patient location during evaluation: Mother Baby Anesthesia Type: Epidural Level of consciousness: awake and alert Pain management: pain level controlled Vital Signs Assessment: post-procedure vital signs reviewed and stable Respiratory status: spontaneous breathing, nonlabored ventilation and respiratory function stable Cardiovascular status: stable Postop Assessment: no headache, no backache and epidural receding Anesthetic complications: no   No complications documented.  Last Vitals:  Vitals:   08/08/20 1700 08/08/20 1800  BP: 107/60 (!) 101/51  Pulse: 93 81  Resp: (!) 24 (!) 21  Temp:    SpO2: 97% 97%    Last Pain:  Vitals:   08/08/20 1700  TempSrc:   PainSc: 0-No pain   Pain Goal: Patients Stated Pain Goal:  (with cough) (08/08/20 1200)                 Chinwe Lope

## 2020-08-08 NOTE — Anesthesia Postprocedure Evaluation (Signed)
Anesthesia Post Note  Patient: Kristine Mcclure  Procedure(s) Performed: CESAREAN SECTION (N/A )     Patient location during evaluation: Mother Baby Anesthesia Type: Epidural Level of consciousness: awake and alert Pain management: pain level controlled Vital Signs Assessment: post-procedure vital signs reviewed and stable Respiratory status: spontaneous breathing, nonlabored ventilation and respiratory function stable Cardiovascular status: stable Postop Assessment: no headache, no backache and epidural receding Anesthetic complications: no   No complications documented.  Last Vitals:  Vitals:   08/08/20 1700 08/08/20 1800  BP: 107/60 (!) 101/51  Pulse: 93 81  Resp: (!) 24 (!) 21  Temp:    SpO2: 97% 97%    Last Pain:  Vitals:   08/08/20 1700  TempSrc:   PainSc: 0-No pain   Pain Goal: Patients Stated Pain Goal:  (with cough) (08/08/20 1200)                 Satvik Parco

## 2020-08-08 NOTE — Lactation Note (Addendum)
This note was copied from a baby's chart. Lactation Consultation Note  Patient Name: Boy Clyde Upshaw [id number] Date: 08/08/2020  P3, 17 hour term female infant. Per dad, mom is in NICU due [place] and  C/S.  Per dad, infant was taken to ICU to visit mom and he BF for 40 minutes without difficulties. RN there also helped mom with hand express while infant was breastfeeding  and infant was given mom's EBM. Mom will start using a DEBP in NICU, DEBP kit attachments given with bottles to be taken to ICU mom in room 2M12.  Permission from Charge RN Thomes Dinning) RN will take pump [id number] # [id number] equipment control. Dad  understands once mom is no longer in ICU if moved to another unit the pump is to follow mom and not be left in ICU.   Per dad, mom has good milk supply, mom  recently stopped BF their 72 year old son and she BF her her other  two children for 4 years each. Per dad, infant is currently  consuming 21 mls of donor breast milk per feeding. Once mom start pumping and hand expression infant will be given mom's EBM.  Dad doesn't have any questions or concerns for LC at this time.   Maternal Data    Feeding Feeding Type: Breast Milk Nipple Type: Extra Slow Flow  LATCH Score                   Interventions    Lactation Tools Discussed/Used     Consult Status      Vicente Serene 08/08/2020, 7:10 PM

## 2020-08-08 NOTE — Lactation Note (Signed)
This note was copied from a baby's chart. Lactation Consultation Note  Patient Name: Kristine Mcclure [id number] Date: 08/08/2020    Initial visit at 8 hours of life. Dad was in room with infant changing diaper. Infant also cueing to be fed. Mom is in ICU. Dad said they preferred to use DBM, but there wasn't any available when infant was first born. I contacted the [facility] & was able to obtain some thawed DBM. DBM consent signed.   Infant did well with a bottle. Infant switched to extra-slow flow nipple with good results, as yellow slow-flow nipple seemed too fast. RN given report.   RN to work on procuring more DBM for this infant.  Lurline Hare [facility] 08/08/2020, 10:01 AM

## 2020-08-08 NOTE — Progress Notes (Signed)
Pt tx from PACU to 30M. Pt is Alert and oriented X4. Skin assessment was performed with 2RNs. Pt's VS are stable and pt is resting comfortably in room.

## 2020-08-08 NOTE — Consult Note (Signed)
NAME:  Kristine Mcclure, MRN:  [id number], DOB:  16-Jan-1981, LOS: 1 ADMISSION DATE:  08/07/2020, CONSULTATION DATE: 08/08/2020 REFERRING MD: Ernestina Penna, CHIEF COMPLAINT: Post C-section bleeding possible DIC  Brief History   39 year old post C-section with concern for DIC resulting in post C-section bleeding requiring exploratory lap  History of present illness   Patient is a 39 year old was delivered early this morning with an uneventful C-section subsequently developed bleeding and postop required return to the operating room with bleeding felt to be coming from the uterus that was controlled.  There is a coincidental concern for DIC as a cause for this postoperative bleeding.  She had approximately 100 mL of blood during the C-section and another liter plus postdelivery.  So far she is received 3 units packed red blood cells 2 units FFP 1 unit of cryoprecipitate. Post exploratory lap patient had a hemoglobin of 10, fibrinogen 159, INR of 1.3 and a PTT of 24.  Past Medical History  Patient denies significant medical history  Significant Hospital Events   Events from this morning as outlined above  Consults:  PCCM  Procedures:  C-section, exploratory lap  Significant Diagnostic Tests:  NA  Micro Data:  NA  Antimicrobials:  NA  Interim history/subjective:  NA  Objective   Blood pressure (!) 109/54, pulse 80, temperature (!) 100.7 F (38.2 C), resp. rate (!) 24, height 5\' 3"  (1.6 m), weight 88.7 kg, SpO2 98 %, unknown if currently breastfeeding.        Intake/Output Summary (Last 24 hours) at 08/08/2020 0648 Last data filed at 08/08/2020 08/10/2020 Gross per 24 hour  Intake 7721 ml  Output 3312 ml  Net 4409 ml   Filed Weights   08/07/20 0749  Weight: 88.7 kg    Examination: General: Pleasant female in no distress HENT: Within normal limits Lungs: Clear Cardiovascular: Regular rate and rhythm Abdomen: Postoperative Extremities: Within normal limits Neuro: Awake  alert nonfocal GU: N/A  Resolved Hospital Problem list   NA  Assessment & Plan:  1.  Post C-section hemorrhage with possibility of DIC: We'll monitor CBC and DIC profile through the day.  We'll keep in ICU for close vital sign monitoring.  Best practice:  Diet: Per OB Pain/Anxiety/Delirium protocol (if indicated): Roby VAP protocol (if indicated): N/A DVT prophylaxis: N/A GI prophylaxis: N/A Glucose control: NA Mobility: Bedrest Code Status: Full Family Communication: Discussed with patient Disposition: To ICU  Labs   CBC: Recent Labs  Lab 08/07/20 0828 08/08/20 0455  WBC 9.3 25.8*  HGB 13.6 10.3*  HCT 39.5 31.4*  MCV 89.2 94.3  PLT 184 132*    Basic Metabolic Panel: Recent Labs  Lab 08/07/20 0926  NA 136  K 4.2  CL 106  CO2 21*  GLUCOSE 77  BUN 8  CREATININE 0.81  CALCIUM 9.4   GFR: Estimated Creatinine Clearance: 99.5 mL/min (by C-G formula based on SCr of 0.81 mg/dL). Recent Labs  Lab 08/07/20 0828 08/08/20 0455  WBC 9.3 25.8*    Liver Function Tests: Recent Labs  Lab 08/07/20 0926  AST 34  ALT 52*  ALKPHOS 116  BILITOT 0.6  PROT 6.1*  ALBUMIN 3.0*   No results for input(s): LIPASE, AMYLASE in the last 168 hours. No results for input(s): AMMONIA in the last 168 hours.  ABG No results found for: PHART, PCO2ART, PO2ART, HCO3, TCO2, ACIDBASEDEF, O2SAT   Coagulation Profile: Recent Labs  Lab 08/08/20 0353  INR 1.3*    Cardiac Enzymes: No results for input(s):  CKTOTAL, CKMB, CKMBINDEX, TROPONINI in the last 168 hours.  HbA1C: No results found for: HGBA1C  CBG: Recent Labs  Lab 08/07/20 0914 08/08/20 0028  GLUCAP 77 153*    Review of Systems:   Unremarkable other than that mentioned in history of present illness  Past Medical History  She,  has a past medical history of Preterm delivery without spontaneous labor (08/02/2011) and Preterm labor.   Surgical History    Past Surgical History:  Procedure Laterality Date  .  NO PAST SURGERIES    . WISDOM TOOTH EXTRACTION       Social History   reports that she has never smoked. She has never used smokeless tobacco. She reports that she does not drink alcohol and does not use drugs.   Family History   Her family history is not on file.   Allergies No Known Allergies   Home Medications  Prior to Admission medications   Medication Sig Start Date End Date Taking? Authorizing Provider  acetaminophen (TYLENOL) 325 MG tablet Take 2 tablets (650 mg total) by mouth every 4 (four) hours as needed (for pain scale < 4). 12/27/15   Standard, Venus, CNM  ibuprofen (ADVIL,MOTRIN) 600 MG tablet Take 1 tablet (600 mg total) by mouth every 6 (six) hours. 12/27/15   Standard, Venus, CNM  prenatal vitamin w/FE, FA (PRENATAL 1 + 1) 27-1 MG TABS Take 1 tablet by mouth at bedtime.     [provider]  Ranitidine HCl (ZANTAC PO) Take 1 tablet by mouth daily as needed. For heartburn.    [provider]     Critical care time: Over 35 minutes was spent bedside evaluation chart review and critical care planning

## 2020-08-08 NOTE — Brief Op Note (Signed)
08/08/2020  5:43 AM  PATIENT:  Kristine Mcclure  39 y.o. female  PRE-OPERATIVE DIAGNOSIS:   postpartum hemorrhage, DIC  POST-OPERATIVE DIAGNOSIS:  pp hemorrhage, DIC  PROCEDURE: Exploratory laparotomy,   exploration of hysterotomy SURGEON:  Surgeon(s) and Role:    * Noland Fordyce, MD - Primary    * Anyanwu, Jethro Bastos, MD - Assisting  PHYSICIAN ASSISTANT:   ASSISTANTS: Arlan Organ, CNM  ANESTHESIA:   epidural  EBL: Approximately 3000 cc including original C-section, blood loss in the PACU and blood loss on the takeback OR  BLOOD ADMINISTERED:3 CC PRBC 2 units FFP, 1 unit cryoprecipitate  DRAINS: (1) Jackson-Pratt drain(s) with closed bulb suction in the Left lower quadrant, Urinary Catheter (Foley) and Bakri balloon in the uterus   LOCAL MEDICATIONS USED:  NONE  SPECIMEN:  No Specimen  DISPOSITION OF SPECIMEN:  N/A  COUNTS:  YES  TOURNIQUET:  * No tourniquets in log *  DICTATION: .Note written in EPIC  PLAN OF CARE: Admit to inpatient   PATIENT DISPOSITION:  ICU - extubated and stable.   Delay start of Pharmacological VTE agent (>24hrs) due to surgical blood loss or risk of bleeding: yes

## 2020-08-08 NOTE — Anesthesia Preprocedure Evaluation (Signed)
Anesthesia Evaluation  Patient identified by MRN, date of birth, ID band Patient awake    Reviewed: Allergy & Precautions, H&P , NPO status , Patient's Chart, lab work & pertinent test results, reviewed documented beta blocker date and time   Airway Mallampati: I  TM Distance: >3 FB Neck ROM: full    Dental no notable dental hx. (+) Teeth Intact, Dental Advisory Given   Pulmonary neg pulmonary ROS,    Pulmonary exam normal breath sounds clear to auscultation       Cardiovascular hypertension, Normal cardiovascular exam Rhythm:regular Rate:Normal     Neuro/Psych negative neurological ROS  negative psych ROS   GI/Hepatic negative GI ROS, Neg liver ROS,   Endo/Other  diabetes, GestationalMorbid obesity  Renal/GU negative Renal ROS  negative genitourinary   Musculoskeletal   Abdominal   Peds  Hematology negative hematology ROS (+)   Anesthesia Other Findings   Reproductive/Obstetrics (+) Pregnancy                             Anesthesia Physical  Anesthesia Plan  ASA: II and emergent  Anesthesia Plan: Epidural   Post-op Pain Management:    Induction:   PONV Risk Score and Plan: 2  Airway Management Planned: Natural Airway  Additional Equipment:   Intra-op Plan:   Post-operative Plan:   Informed Consent: I have reviewed the patients History and Physical, chart, labs and discussed the procedure including the risks, benefits and alternatives for the proposed anesthesia with the patient or authorized representative who has indicated his/her understanding and acceptance.       Plan Discussed with: Anesthesiologist  Anesthesia Plan Comments:         Anesthesia Quick Evaluation

## 2020-08-08 NOTE — Progress Notes (Signed)
Chaplain responded to page for patient transfusion for new mother.  Chaplain arrived, patient in surgery, dad and baby have gone to nursery.  No needs at this time. Chaplain will have day chaplain follow up with the family.  Chaplain available as needed. Chaplain Agustin Cree, MDiv.

## 2020-08-09 ENCOUNTER — Encounter (HOSPITAL_COMMUNITY): Payer: Self-pay | Admitting: Obstetrics

## 2020-08-09 DIAGNOSIS — D65 Disseminated intravascular coagulation [defibrination syndrome]: Secondary | ICD-10-CM | POA: Diagnosis not present

## 2020-08-09 DIAGNOSIS — O99892 Other specified diseases and conditions complicating childbirth: Secondary | ICD-10-CM

## 2020-08-09 HISTORY — DX: Other specified diseases and conditions complicating childbirth: O99.892

## 2020-08-09 LAB — PREPARE CRYOPRECIPITATE: Unit division: 0

## 2020-08-09 LAB — BPAM FFP
Blood Product Expiration Date: 202110312359
Blood Product Expiration Date: 202110312359
ISSUE DATE / TIME: 202110280424
ISSUE DATE / TIME: 202110280424
Unit Type and Rh: 8400
Unit Type and Rh: 8400

## 2020-08-09 LAB — PREPARE FRESH FROZEN PLASMA: Unit division: 0

## 2020-08-09 LAB — COMPREHENSIVE METABOLIC PANEL
ALT: 28 U/L (ref 0–44)
AST: 32 U/L (ref 15–41)
Albumin: 1.9 g/dL — ABNORMAL LOW (ref 3.5–5.0)
Alkaline Phosphatase: 62 U/L (ref 38–126)
Anion gap: 7 (ref 5–15)
BUN: 9 mg/dL (ref 6–20)
CO2: 23 mmol/L (ref 22–32)
Calcium: 8 mg/dL — ABNORMAL LOW (ref 8.9–10.3)
Chloride: 108 mmol/L (ref 98–111)
Creatinine, Ser: 0.8 mg/dL (ref 0.44–1.00)
GFR, Estimated: >60 mL/min (ref >60)
Glucose, Bld: 106 mg/dL — ABNORMAL HIGH (ref 70–99)
Potassium: 3.7 mmol/L (ref 3.5–5.1)
Sodium: 138 mmol/L (ref 135–145)
Total Bilirubin: 0.4 mg/dL (ref 0.3–1.2)
Total Protein: 4 g/dL — ABNORMAL LOW (ref 6.5–8.1)

## 2020-08-09 LAB — BPAM CRYOPRECIPITATE
Blood Product Expiration Date: 202110281045
ISSUE DATE / TIME: 202110280501
Unit Type and Rh: 6200

## 2020-08-09 LAB — HEMOGLOBIN AND HEMATOCRIT, BLOOD
HCT: 24.7 % — ABNORMAL LOW (ref 36.0–46.0)
Hemoglobin: 8.5 g/dL — ABNORMAL LOW (ref 12.0–15.0)

## 2020-08-09 LAB — CBC
HCT: 21.8 % — ABNORMAL LOW (ref 36.0–46.0)
Hemoglobin: 7.4 g/dL — ABNORMAL LOW (ref 12.0–15.0)
MCH: 30.5 pg (ref 26.0–34.0)
MCHC: 33.9 g/dL (ref 30.0–36.0)
MCV: 89.7 fL (ref 80.0–100.0)
Platelets: 119 10*3/uL — ABNORMAL LOW (ref 150–400)
RBC: 2.43 MIL/uL — ABNORMAL LOW (ref 3.87–5.11)
RDW: 14.4 % (ref 11.5–15.5)
WBC: 16.6 10*3/uL — ABNORMAL HIGH (ref 4.0–10.5)
nRBC: 0 % (ref 0.0–0.2)

## 2020-08-09 LAB — PREPARE RBC (CROSSMATCH)

## 2020-08-09 MED ORDER — SIMETHICONE 80 MG PO CHEW: 80.0000 mg | CHEWABLE_TABLET | Freq: Three times a day (TID) | ORAL | Status: DC

## 2020-08-09 MED ORDER — SIMETHICONE 80 MG PO CHEW: 80.0000 mg | CHEWABLE_TABLET | ORAL | Status: DC | PRN

## 2020-08-09 MED ORDER — COCONUT OIL OIL: 1.0000 "application " | TOPICAL_OIL | Status: DC | PRN

## 2020-08-09 MED ORDER — TETANUS-DIPHTH-ACELL PERTUSSIS 5-2.5-18.5 LF-MCG/0.5 IM SUSY: 0.5000 mL | PREFILLED_SYRINGE | Freq: Once | INTRAMUSCULAR | Status: DC

## 2020-08-09 MED ORDER — SENNOSIDES-DOCUSATE SODIUM 8.6-50 MG PO TABS: 2.0000 | ORAL_TABLET | ORAL | Status: DC

## 2020-08-09 MED ORDER — DIPHENHYDRAMINE HCL 25 MG PO CAPS: 25.0000 mg | ORAL_CAPSULE | Freq: Four times a day (QID) | ORAL | Status: DC | PRN

## 2020-08-09 MED ORDER — PRENATAL MULTIVITAMIN CH: 1.0000 | ORAL_TABLET | Freq: Every day | ORAL | Status: DC

## 2020-08-09 MED ORDER — SODIUM CHLORIDE 0.9% IV SOLUTION: Freq: Once | INTRAVENOUS | Status: AC

## 2020-08-09 MED ORDER — WITCH HAZEL-GLYCERIN EX PADS: 1.0000 "application " | MEDICATED_PAD | CUTANEOUS | Status: DC | PRN

## 2020-08-09 MED ORDER — LACTATED RINGERS IV SOLN: INTRAVENOUS | Status: DC

## 2020-08-09 MED ORDER — SIMETHICONE 80 MG PO CHEW: 80.0000 mg | CHEWABLE_TABLET | ORAL | Status: DC

## 2020-08-09 MED ORDER — DIBUCAINE (PERIANAL) 1 % EX OINT: 1.0000 "application " | TOPICAL_OINTMENT | CUTANEOUS | Status: DC | PRN

## 2020-08-09 MED ORDER — MENTHOL 3 MG MT LOZG: 1.0000 | LOZENGE | OROMUCOSAL | Status: DC | PRN

## 2020-08-09 MED ORDER — OXYTOCIN-SODIUM CHLORIDE 30-0.9 UT/500ML-% IV SOLN: 2.5000 [IU]/h | INTRAVENOUS | Status: AC

## 2020-08-09 NOTE — Progress Notes (Addendum)
NAME:  Kristine Mcclure, MRN:  [id number], DOB:  [birth year], LOS: 2 ADMISSION DATE:  08/07/2020, CONSULTATION DATE: 08/08/2020 REFERRING MD: Ernestina Penna, CHIEF COMPLAINT: Post C-section bleeding possible DIC  Brief History   39 year old post C-section with concern for DIC resulting in post C-section bleeding requiring exploratory lap  History of present illness   Patient is a 39 year old was delivered early this morning with an uneventful C-section subsequently developed bleeding and postop required return to the operating room with bleeding felt to be coming from the uterus that was controlled.  There is a coincidental concern for DIC as a cause for this postoperative bleeding.  She had approximately 100 mL of blood during the C-section and another liter plus postdelivery.  So far she is received 3 units packed red blood cells 2 units FFP 1 unit of cryoprecipitate. Post exploratory lap patient had a hemoglobin of 10, fibrinogen 159, INR of 1.3 and a PTT of 24.  Past Medical History  Patient denies significant medical history  Significant Hospital Events   10/28 - Admitted to ICU   Consults:  PCCM  Procedures:  C-section, exploratory lap - 10/28  Significant Diagnostic Tests:  NA  Micro Data:  NA  Antimicrobials:  NA  Interim history/subjective:  States pain is well controlled currently. Denies complaints.  Objective   Blood pressure 111/71, pulse 78, temperature 99.3 F (37.4 C), temperature source Oral, resp. rate (!) 24, height 5\' 3"  (1.6 m), weight 88.7 kg, SpO2 97 %, unknown if currently breastfeeding.        Intake/Output Summary (Last 24 hours) at 08/09/2020 0937 Last data filed at 08/09/2020 0600 Gross per 24 hour  Intake 1960 ml  Output 3140 ml  Net -1180 ml   Filed Weights   08/07/20 0749  Weight: 88.7 kg    Examination: General: Awake and alert in no distress HENT:WNL Lungs: CTA bilaterally Cardiovascular: RRR Abdomen: Bowel sounds present.  Clean, dry dressing present without obvious signs of bleeding  Neuro: Awake and alert  Resolved Hospital Problem list     Assessment & Plan:   Post C-section hemorrhage with concern for DIC:  Currently in the ICU for close monitoring of vitals, signs of bleeding and labs. There was an initial concern for DIC. Patient's hemoglobin currently decreased from 10.2>9.4>8.2>8.1>7.4. Platelets stable at 119. WBC 16.6, likely secondary to stress response from patient's surgery. Prothrombin time and INR were initally mildly elevated but now within normal limits. APTT continues within normal limits. - Will reach out to anesthesia for epidural removal - Can transfer from ICU to floor status. CCM will sign off. Discussed with Dr. 08/09/20, she plans to slowly remove the balloon over a few hours with expectation to transfer to the floor this afternoon.   Best practice:  Diet: Per OB Pain/Anxiety/Delirium protocol (if indicated): Roby VAP protocol (if indicated): N/A DVT prophylaxis: N/A GI prophylaxis: N/A Glucose control: NA Mobility: Bedrest Code Status: Full Family Communication: Spoke with patient 10/29 Disposition: Transfer to floor pending removal of balloon, discussed with Dr. 11/29, she plans to slowly remove the balloon over a few hours with expectation to transfer to the floor this afternoon.  Labs   CBC: Recent Labs  Lab 08/08/20 0745 08/08/20 1112 08/08/20 1540 08/08/20 2034 08/09/20 0132  WBC 22.0* 21.3* 18.4* 18.4* 16.6*  HGB 10.2* 9.4* 8.2* 8.1* 7.4*  HCT 29.5* 26.8* 23.9* 23.3* 21.8*  MCV 89.4 89.3 88.5 89.3 89.7  PLT 120* 105* 117* 119* 119*    Basic Metabolic  Panel: Recent Labs  Lab 08/07/20 0926 08/09/20 0132  NA 136 138  K 4.2 3.7  CL 106 108  CO2 21* 23  GLUCOSE 77 106*  BUN 8 9  CREATININE 0.81 0.80  CALCIUM 9.4 8.0*   GFR: Estimated Creatinine Clearance: 100.7 mL/min (by C-G formula based on SCr of 0.8 mg/dL). Recent Labs  Lab 08/08/20 1112 08/08/20 1540  08/08/20 2034 08/09/20 0132  WBC 21.3* 18.4* 18.4* 16.6*    Liver Function Tests: Recent Labs  Lab 08/07/20 0926 08/09/20 0132  AST 34 32  ALT 52* 28  ALKPHOS 116 62  BILITOT 0.6 0.4  PROT 6.1* 4.0*  ALBUMIN 3.0* 1.9*   No results for input(s): LIPASE, AMYLASE in the last 168 hours. No results for input(s): AMMONIA in the last 168 hours.  ABG No results found for: PHART, PCO2ART, PO2ART, HCO3, TCO2, ACIDBASEDEF, O2SAT   Coagulation Profile: Recent Labs  Lab 08/08/20 0353 08/08/20 1112  INR 1.3* 1.1    Cardiac Enzymes: No results for input(s): CKTOTAL, CKMB, CKMBINDEX, TROPONINI in the last 168 hours.  HbA1C: No results found for: HGBA1C  CBG: Recent Labs  Lab 08/07/20 0914 08/08/20 0028  GLUCAP 77 153*    Past Medical History  She,  has a past medical history of Preterm delivery without spontaneous labor (08/02/2011) and Preterm labor.   Surgical History    Past Surgical History:  Procedure Laterality Date  . CESAREAN SECTION N/A 08/08/2020   Procedure: CESAREAN SECTION;  Surgeon: Noland Fordyce, MD;  Location: [facility] LD ORS;  Service: Obstetrics;  Laterality: N/A;  . DILATION AND EVACUATION N/A 08/08/2020   Procedure: DILATATION AND EVACUATION;  Surgeon: Noland Fordyce, MD;  Location: [facility] LD ORS;  Service: Gynecology;  Laterality: N/A;  . LAPAROTOMY N/A 08/08/2020   Procedure: EXPLORATORY LAPAROTOMY;  Surgeon: Noland Fordyce, MD;  Location: [facility] LD ORS;  Service: Gynecology;  Laterality: N/A;  . NO PAST SURGERIES    . WISDOM TOOTH EXTRACTION       Social History   reports that she has never smoked. She has never used smokeless tobacco. She reports that she does not drink alcohol and does not use drugs.   Family History   Her family history is not on file.   Allergies No Known Allergies   Home Medications  Prior to Admission medications   Medication Sig Start Date End Date Taking? Authorizing Provider  acetaminophen (TYLENOL) 325 MG tablet Take 2  tablets (650 mg total) by mouth every 4 (four) hours as needed (for pain scale < 4). 12/27/15   Standard, Venus, CNM  ibuprofen (ADVIL,MOTRIN) 600 MG tablet Take 1 tablet (600 mg total) by mouth every 6 (six) hours. 12/27/15   Standard, Venus, CNM  prenatal vitamin w/FE, FA (PRENATAL 1 + 1) 27-1 MG TABS Take 1 tablet by mouth at bedtime.     [provider]  Ranitidine HCl (ZANTAC PO) Take 1 tablet by mouth daily as needed. For heartburn.    [provider]    Jackelyn Poling, DO

## 2020-08-09 NOTE — Progress Notes (Signed)
Subjective: Postpartum Day one: Cesarean Delivery; POD1 s/p ex lap for PPH complicated by DIC  Patient is doing relatively well this morning. She reports some incisional pain with cough and movement in the bed, but well controlled with current pain regimen. Has been tolerating a regular diet and denies any N/V. Reports feeling tired and increased fatigue after taking pain medications but denies any dizziness or feeling light headed. Denies HA, vision changes, RUQ or epigastric pain. Reports swelling in her hands unchanged from yesterday. Bakri balloon in place, minimal lochia on the pad, requiring change x 1 overnight. Foley catheter in and draining. Has not been up to ambulate with balloons in place. Baby boy was able to come visit at bedside and able to latch and nurse twice. Emotionally stable, is tearful at bedside but reports good support from family and denies any current feelings of overwhelming anxiety or depression.    Objective: Patient Vitals for the past 24 hrs:  BP Temp Temp src Pulse Resp SpO2  08/09/20 1000 114/77 -- -- 91 (!) 26 98 %  08/09/20 0900 108/66 -- -- 84 (!) 21 97 %  08/09/20 0800 111/71 -- -- 78 (!) 24 97 %  08/09/20 0700 104/73 -- -- 79 (!) 25 97 %  08/09/20 0600 102/71 -- -- 82 (!) 23 95 %  08/09/20 0500 106/66 -- -- 90 (!) 21 99 %  08/09/20 0400 105/73 -- -- 92 (!) 21 97 %  08/09/20 0327 -- 99.3 F (37.4 C) Oral 82 18 96 %  08/09/20 0300 99/65 -- -- 90 18 95 %  08/09/20 0200 100/64 -- -- 87 (!) 22 94 %  08/09/20 0100 (!) 103/58 -- -- 93 13 94 %  08/09/20 0000 106/66 -- -- 91 17 96 %  08/08/20 2354 -- 98.6 F (37 C) Oral -- -- --  08/08/20 2324 116/71 -- -- (!) 105 11 97 %  08/08/20 2300 (!) 87/69 -- -- (!) 102 18 99 %  08/08/20 2200 115/69 -- -- 94 18 97 %  08/08/20 2100 97/86 -- -- (!) 102 (!) 36 95 %  08/08/20 2001 -- 99.1 F (37.3 C) Oral -- -- --  08/08/20 2000 107/60 -- -- 96 (!) 23 96 %  08/08/20 1800 (!) 101/51 -- -- 81 (!) 21 97 %  08/08/20 1700  107/60 -- -- 93 (!) 24 97 %  08/08/20 1600 108/63 -- -- 100 (!) 29 97 %  08/08/20 1551 -- 98.8 F (37.1 C) Oral -- -- --  08/08/20 1500 100/67 -- -- 84 (!) 24 96 %  08/08/20 1400 107/60 -- -- 99 (!) 28 95 %  08/08/20 1300 (!) 107/53 -- -- 76 (!) 23 96 %  08/08/20 1200 106/66 -- -- 86 (!) 23 100 %  08/08/20 1141 -- 98.3 F (36.8 C) Oral -- -- --  08/08/20 1100 105/69 -- -- 80 (!) 26 94 %   UOP 2,710 cc overnight (12hr) Net 24hr -910 cc  Physical Exam:  General: alert, no distress and well appearing  Heart: RRR Lungs: CTABL no wheezes no rales no crackles Lochia: appropriate: scant < 1/4 pad saturated, Bakri balloon drain 100cc total output since OR dark blood  Uterine Fundus: firm at 1 below U, slightly tilted to the right, appropriately tender on palpation Incision: healing well, no significant drainage- JP drain left side minimal output <10cc DVT Evaluation: No evidence of DVT seen on physical exam. Negative Homan's sign. Neuro: +1 b/l LE DTR's NEG clonus  Recent Labs    08/08/20 2034 08/09/20 0132  HGB 8.1* 7.4*  HCT 23.3* 21.8*    Assessment/Plan: Status post Cesarean section. Postoperative course complicated by PPH in DIC with return to OR for ex lap procedure   RANIYA GOLEMBESKI [id number] POD#1 sp pLTCS arrest of descent at 39.2 IOL for preE and POD#1 s/p ex lap for PPH in DIC. Currently stable in the ICU and clinically improving  1. PPH: Hemodynamically stable with no evidence of any active ongoing bleeding. She is s/p MTP with 3U PRBCs, 2U FFP, and 1U of FFP. Initial postop Hct 26.8 10/28 @ 1112, has since equilibrated and most recently  H/H 7.4/21.8 at 0130 this morning. Vitals remain stable and UOP adequate. Minimal output from JP left incisional drain. Bakri output of 100cc appropriate for PP status. Will transfuse 1U PRBC's with goal Hgb >8, post-transfusion CBC ordered. Slow gradual removal of Bakri balloon and continued close monitoring of VB with fundal checks and  pad counts. DIC appears to have resolved with normalization of fibrinogen, stable platelets of 119, and normal coags. Anesthesia notified of stable platelets, plan to remove epidural catheter once patient returns to postpartum floor 2. PreE: asymptomatic with normal PIH labs. BP's normotensive but may be related to blood loss hypovolemia. Continue to closely monitor BP's, low threshold for magnesium seizure prophylaxis and antihypertensives if needed. Diuresing well, cont close monitoring of I/Os 3. Cont routine postop pain medications, well controlled on current regimen 4. Plan to have patient out of bed with assistance as tolerated later today. SCD's on while in bed VTE ppx 5. Plan to d/c Foley catheter once Bakri out, blood transfusion complete and bleeding continues to be stable  6. Regular diet 7. Routine postpartum care. Lactation support as needed, encourage continued pumping and feeding as tolerated  Evelyne Makepeace A Celso Granja 08/09/2020, 10:39 AM

## 2020-08-09 NOTE — Progress Notes (Signed)
Patient assessed, chart reviewed and spoke with ICU RN and Ante/High risk unit RN.  S: Pt reports cramps with breast feeding, took 5 mg Oxycodone, RN giving additional 10 Oxycodone for pain at 5/10. Cannot take Ibuprofen due to recovering from DIC Has not moved out of bed or dangled. Foley indwelling. Bakri out earlier with minimal lochia  Tolerating regular diet. No N/V/ no F/C/. NO CP/ SOB/ blurred vision/ epig pain.   Temp:  [98.5 F (36.9 C)-99.4 F (37.4 C)] 99 F (37.2 C) (10/29 1542) Pulse Rate:  [70-105] 82 (10/29 1600) Resp:  [11-36] 28 (10/29 1600) BP: (87-124)/(51-86) 123/79 (10/29 1600) SpO2:  [94 %-99 %] 94 % (10/29 1600)  BP 123/79   Pulse 82   Temp 99 F (37.2 C) (Axillary)   Resp (!) 28   Ht 5\' 3"  (1.6 m)   Wt 88.7 kg   SpO2 94%   Breastfeeding Unknown   BMI 34.63 kg/m   Physical exam:  A&O x 3, no acute distress. Pleasant HEENT neg Lungs CTA bilat CV RRR, S1S2 normal Abdo soft, non tender, non acute. Active BS Extr no edema/ tenderness, DTR +2 Pelvic def/ no active bleeding per RN  CBC Latest Ref Rng & Units 08/09/2020 08/09/2020 08/08/2020  WBC 4.0 - 10.5 K/uL - 16.6(H) 18.4(H)  Hemoglobin 12.0 - 15.0 g/dL 08/10/2020) 7.4(L) 8.1(L)  Hematocrit 36 - 46 % 24.7(L) 21.8(L) 23.3(L)  Platelets 150 - 400 K/uL - 119(L) 119(L)   CMP Latest Ref Rng & Units 08/09/2020 08/07/2020   Glucose 70 - 99 mg/dL 08/09/2020) 77   BUN 6 - 20 mg/dL 9 8   Creatinine 932(I - 1.00 mg/dL 7.12 4.58   Sodium 0.99 - 145 mmol/L 138 136   Potassium 3.5 - 5.1 mmol/L 3.7 4.2   Chloride 98 - 111 mmol/L 108 106   CO2 22 - 32 mmol/L 23 21(L)   Calcium 8.9 - 10.3 mg/dL 8.0(L) 9.4   Total Protein 6.5 - 8.1 g/dL 4.0(L) 6.1(L)   Total Bilirubin 0.3 - 1.2 mg/dL 0.4 0.6   Alkaline Phos 38 - 126 U/L 62 116   AST 15 - 41 U/L 32 34   ALT 0 - 44 U/L 28 52(H)    Completed 1 additional packed RBCs  A/P: Stable , post- op C/s with reexploration in few hours due to uterine angle bleeding. Hemorrhage  leading to DIC, has recovered from it.  S/p 1 additional pRBC unit today.  Hemodynamically stable, will transfer to High Risk Ob/ Ante unit 1 833 for close monitoring due to post-op course and Preeclampsia at admission, at present BPs wnl, could be from recent hypovolemia.  Daily weights, I/O and close monitoring of VS Remove foley when ready to sit and ambulate, may consider bed-side commode since strict I/O needed CBC tomorrow AM  Pain mngmt   V.Laquilla Dault,MD

## 2020-08-09 NOTE — Progress Notes (Signed)
~  1030 OB MD & Midwife rounded.  Requested 1u PRBC (hgb >/= 8) and deflation of Bakri balloon w/o complications to be completed p/t transitioning out of ICU.  Initially MD removed 55mL from balloon & will return to remove additional mL's until deflated.  Foley catheter to remain until then as well.  I asked the OB team to kindly let anesthesia know to come d/c the epidural. I will continue with pain management, monitor VS, watch for bleeding, etc.  ~1300 Dr. Conni Elliot removed last of the Bakri balloon fluid and the Bakri device.  Encouraged pt to sit on side of bed once blood finished.  Most likely will transfer to 4th floor room with baby.  ~1130 as I was starting blood, pt c/o mild tension like headache, nothing intense, no change in SPB/VS.  MD was made aware w/ removal of Bakri; at that time pt reported HA had resolved.   ~1400 s/w Dr. Renold Don in anesthesia [phone]) re removal of epidural. He advised "Dr. Donavan Foil is following this patient on the Women's side and there is a plan".  ~1600 baby & Dad visited; baby successfully nursed.  S/w OB MD Mody - plan to transfer to 1st floor ante natal.  When Peds RN & Dad returned to 4th floor, they took the breast pump in anticipation of transfer.  Per Dr. Juliene Pina, anesthesia will come to her ante natal room to remove the epidural.

## 2020-08-10 DIAGNOSIS — D65 Disseminated intravascular coagulation [defibrination syndrome]: Secondary | ICD-10-CM | POA: Diagnosis not present

## 2020-08-10 DIAGNOSIS — O9902 Anemia complicating childbirth: Secondary | ICD-10-CM | POA: Diagnosis not present

## 2020-08-10 HISTORY — DX: Disseminated intravascular coagulation (defibrination syndrome): D65

## 2020-08-10 HISTORY — DX: Anemia complicating childbirth: O99.02

## 2020-08-10 LAB — CBC
HCT: 23.7 % — ABNORMAL LOW (ref 36.0–46.0)
Hemoglobin: 8.1 g/dL — ABNORMAL LOW (ref 12.0–15.0)
MCH: 30.9 pg (ref 26.0–34.0)
MCHC: 34.2 g/dL (ref 30.0–36.0)
MCV: 90.5 fL (ref 80.0–100.0)
Platelets: 141 10*3/uL — ABNORMAL LOW (ref 150–400)
RBC: 2.62 MIL/uL — ABNORMAL LOW (ref 3.87–5.11)
RDW: 15 % (ref 11.5–15.5)
WBC: 15 10*3/uL — ABNORMAL HIGH (ref 4.0–10.5)
nRBC: 0 % (ref 0.0–0.2)

## 2020-08-10 MED ORDER — MAGNESIUM OXIDE 400 (241.3 MG) MG PO TABS
400.0000 mg | ORAL_TABLET | Freq: Every day | ORAL | Status: DC
  Administered 2020-08-10 – 2020-08-11 (×2): 400 mg via ORAL
  Filled 2020-08-10 (×2): qty 1

## 2020-08-10 MED ORDER — IBUPROFEN 600 MG PO TABS
600.0000 mg | ORAL_TABLET | Freq: Three times a day (TID) | ORAL | Status: DC
  Administered 2020-08-10 – 2020-08-11 (×4): 600 mg via ORAL
  Filled 2020-08-10 (×4): qty 1

## 2020-08-10 MED ORDER — INFLUENZA VAC SPLIT QUAD 0.5 ML IM SUSY: 0.5000 mL | PREFILLED_SYRINGE | INTRAMUSCULAR | Status: DC

## 2020-08-10 MED ORDER — SODIUM CHLORIDE 0.9 % IV SOLN
510.0000 mg | INTRAVENOUS | Status: DC
  Administered 2020-08-10: 510 mg via INTRAVENOUS
  Filled 2020-08-10: qty 17

## 2020-08-10 NOTE — Progress Notes (Addendum)
Subjective: Cesarean Delivery/arrest of descent, PEC w/o severe features in labor, s/p ex lap for PPH complicated by DIC POD# 2 Live born female  Birth Weight: 8 lb 15.2 oz (4060 g) APGAR: 9, 9  Newborn Delivery   Birth date/time: 08/08/2019 01:16:00 Delivery type: C-Section, Low Transverse Trial of labor: Yes C-section categorization: Primary     Baby name: Jasmine Pang Delivering provider: Aloha Gell   circumcision declined Feeding: breast  Pain control at delivery: Epidural   Reports feeling much better today, was assisted to chair last night and able to sit and breastfeed newborn. Has not been out of bed during night.   Patient reports tolerating PO.   Breast symptoms:+ colostrum Pain controlled with Oxycodone and now added ibuprofen. Comfortable at rest, pain noted with position changes. Denies HA/SOB/C/P/N/V/dizziness. Flatus present. She reports vaginal bleeding as normal, without clots.  Minimal ambulation x 1 to chair with assist, foley cath still indwelling.    Objective:   VS:    Vitals:   08/09/20 1954 08/09/20 2000 08/10/20 0013 08/10/20 0443  BP:  125/72 113/70 124/73  Pulse:  92 80 83  Resp:  (!) 26 (!) 22 (!) 21  Temp: 98.9 F (37.2 C)  98.2 F (36.8 C) 98.6 F (37 C)  TempSrc: Oral  Oral Oral  SpO2:  97% 96% 97%  Weight:      Height:          Intake/Output Summary (Last 24 hours) at 08/10/2020 0745 Last data filed at 08/10/2020 0630 Gross per 24 hour  Intake 1545 ml  Output 3905 ml  Net -2360 ml        Recent Labs    08/09/20 0132 08/09/20 0132 08/09/20 1628 08/10/20 0530  WBC 16.6*  --   --  15.0*  HGB 7.4*   < > 8.5* 8.1*  HCT 21.8*   < > 24.7* 23.7*  PLT 119*  --   --  141*   < > = values in this interval not displayed.   CMP Latest Ref Rng & Units 08/09/2020 08/07/2020 11/08/2015  Glucose 70 - 99 mg/dL 106(H) 77 108(H)  BUN 6 - 20 mg/dL 9 8 9   Creatinine 0.44 - 1.00 mg/dL 0.80 0.81 0.63  Sodium 135 - 145 mmol/L 138 136 136   Potassium 3.5 - 5.1 mmol/L 3.7 4.2 3.7  Chloride 98 - 111 mmol/L 108 106 106  CO2 22 - 32 mmol/L 23 21(L) 21(L)  Calcium 8.9 - 10.3 mg/dL 8.0(L) 9.4 9.0  Total Protein 6.5 - 8.1 g/dL 4.0(L) 6.1(L) 6.8  Total Bilirubin 0.3 - 1.2 mg/dL 0.4 0.6 0.4  Alkaline Phos 38 - 126 U/L 62 116 86  AST 15 - 41 U/L 32 34 33  ALT 0 - 44 U/L 28 52(H) 51   Filed Weights   08/07/20 0749  Weight: 88.7 kg    Blood type: --/--/A POS (10/27 2902)  Rubella: Nonimmune (04/16 0000)  Vaccines: TDaP          UTD         Flu             needs                    COVID-19 UTD   Physical Exam:  General: alert, cooperative and no distress CV: Regular rate and rhythm Resp: clear Abdomen: soft, nontender, normal bowel sounds, mild timpany Incision: clean, dry, intact and JP drain lalateral incision with minimal serous draining Uterine Fundus:  firm, below umbilicus, surgically tender Lochia: minimal Ext: edema trace pedal, no cords or calf tenderness, SCD's applied   Assessment/Plan: 39 y.o.   POD# 2. 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 Hemorrhage leading to DIC, has recovered from it. Transferred to [place] from ICU after observation until yesterday evening.  - Status post 2 units FFP and 1 of cryoprecipitate, no active bleeding  - continue routine post-op care  - DC foley cath this am, encourage ambulation with assist  - consider shower this afternoon in chair if stable Active Problems:   Preeclampsia  - improving, normotensive since delivery, good diuresis, LFTS trending down, no neural sx.  - continue monitoring closely   Rubella non-immune status, antepartum  - offer MMR prior to DC   Postpartum hemorrhage - 3000cc    Maternal anemia, with delivery - ABL  - s/p 3 units PRBC initially, then repeat 1 unit PRBC yesterday, post infusion Hgb 8.5, equilibrated 8.1 this am  - asymptomatic at rest, will monitor for symptoms with increased activity  - plan IV iron infusion today  - add oral fe when ambulatory    Juliene Pina, CNM, MSN 08/10/2020, 7:45 AM  MD note-  Pt assessed as well and chart reviewed, improving well, watch BP trend, may go up with underlying PEC. Bleeding has resolved. Agree with IV Iron  --VMody, MD

## 2020-08-10 NOTE — Lactation Note (Addendum)
This note was copied from a baby's chart. Lactation Consultation Note  Patient Name: Kristine Mcclure [id number] Date: 08/10/2020   Mom hx of Anemia and blood loss post partum. She also received IF Iron and has Gestational DB diet controlled.   Infant is 39 weeks 61 hours old LGA baby with normal glucose and monitored for Hyperbilirubinemia that is on the decline. 2% weight loss.   Infant being fed by Dad with purple slow flow nipple 21 ml per feeding while Mom under care for Anemia and blood loss. Mom starting nursing at the breast 1300 today feeding 20-30 minutes with signs of milk transfer based on cues. Infant had episodes of spitting up during LC visit small quantity < 0.3 ml.  LC recommended with next feeding to burp in between feeding. LC assisted parents with burping the baby and he released a large burp following.   LC latched baby at the breast in football he needed some assistance to open lips and make sure they are flanged. Infant latched at the breast for 20 minutes before Mom switched to opposite side. Prior to latch, LC used breast massage and hand expression with colostrum noted before latching.   Infant had 2 urine and 4 stool today. Dad stated he may have missed some urine in changing the diaper. We reviewed to observe the color change on the diaper before latching.   Plan 1. To nurse based on cues 8-12 x in 24 hour period no more than 4 hours without an attempt. Mom to look for signs of milk transfer and how to increase flow during feed to keep infant active at the breast.            2. Mom to start pumping using DEBP q 3 hours for 15 minutes. Milk storage, parts, assembly and cleaning reviewed with parents.            3. LC brochure reviewed with parents.            4. I's /O's sheet reviewed with parents.

## 2020-08-10 NOTE — Progress Notes (Signed)
Dr. Juliene Pina called per patient request for additional pain medication. Dr. Juliene Pina prescribed 600mg  Ibuprofen Q8. Orders clarified with Dr. for Ibuprofen since Dr. Juliene Pina note on 10/29 states no Ibuprofen due to recovering from DIC. Dr. 11/29 stated ibuprofen ok to give at this time.

## 2020-08-11 LAB — TYPE AND SCREEN
ABO/RH(D): A POS
Antibody Screen: NEGATIVE
Unit division: 0
Unit division: 0
Unit division: 0
Unit division: 0
Unit division: 0

## 2020-08-11 LAB — BPAM RBC
Blood Product Expiration Date: 202111052359
Blood Product Expiration Date: 202111052359
Blood Product Expiration Date: 202111052359
Blood Product Expiration Date: 202111102359
Blood Product Expiration Date: 202111202359
ISSUE DATE / TIME: 202110280324
ISSUE DATE / TIME: 202110280324
ISSUE DATE / TIME: 202110280359
ISSUE DATE / TIME: 202110291100
Unit Type and Rh: 6200
Unit Type and Rh: 6200
Unit Type and Rh: 6200
Unit Type and Rh: 6200
Unit Type and Rh: 6200

## 2020-08-11 MED ORDER — IBUPROFEN 600 MG PO TABS
600.0000 mg | ORAL_TABLET | Freq: Three times a day (TID) | ORAL | 0 refills | Status: DC
Start: 2020-08-11 — End: 2021-12-17

## 2020-08-11 MED ORDER — SIMETHICONE 80 MG PO CHEW
80.0000 mg | CHEWABLE_TABLET | ORAL | 0 refills | Status: DC | PRN
Start: 2020-08-11 — End: 2021-12-17

## 2020-08-11 MED ORDER — ACETAMINOPHEN 500 MG PO TABS: 1000.0000 mg | ORAL_TABLET | Freq: Four times a day (QID) | ORAL | 2 refills | Status: AC | PRN

## 2020-08-11 MED ORDER — COCONUT OIL OIL: 1.0000 "application " | TOPICAL_OIL | 0 refills | Status: DC | PRN

## 2020-08-11 MED ORDER — SENNOSIDES-DOCUSATE SODIUM 8.6-50 MG PO TABS
2.0000 | ORAL_TABLET | ORAL | Status: DC
Start: 2020-08-12 — End: 2021-12-17

## 2020-08-11 MED ORDER — OXYCODONE HCL 5 MG PO TABS
5.0000 mg | ORAL_TABLET | ORAL | 0 refills | Status: DC | PRN
Start: 2020-08-11 — End: 2021-12-17

## 2020-08-11 NOTE — Progress Notes (Signed)
Instructed patient of rubella nonimmune status when prenatal labs were drawn in MD office. CDC VIS on MMR given to patient. She declines MMR vaccine during hospitalization; stated ' I will get it later at MD office."

## 2020-08-11 NOTE — Progress Notes (Signed)
Subjective: POD# 3 Live born female  Birth Weight: 8 lb 15.2 oz (4060 g) APGAR: 9, 9  Newborn Delivery   Birth date/time: 08/08/2020 01:16:00 Delivery type: C-Section, Low Transverse Trial of labor: Yes C-section categorization: Primary     Baby name: Jasmine Pang Delivering provider: Aloha Gell   circumcision declined Feeding: breast  Pain control at delivery: Epidural   Reports feeling much better today, has been able to go to [place] by herself.   Patient reports tolerating PO.   Breast symptoms:+ colostrum Pain controlled with PO meds Denies HA/SOB/C/P/N/V/dizziness. Flatus present. She reports vaginal bleeding as normal, without clots.  She is ambulating, urinating without difficulty.     Objective:   VS:    Vitals:   08/11/20 0045 08/11/20 0435 08/11/20 0750 08/11/20 0900  BP: 134/83 (!) 121/58 129/86   Pulse: 76 70 86   Resp: _0 Temp: 98.2 F (36.8 C) 97.8 F (36.6 C) 98 F (36.7 C)   TempSrc: Oral Oral Oral   SpO2: 97% 99% 97%   Weight:    82.6 kg  Height:          Intake/Output Summary (Last 24 hours) at 08/11/2020 1036 Last data filed at 08/11/2020 0749 Gross per 24 hour  Intake 2780 ml  Output 1850 ml  Net 930 ml        Recent Labs    08/09/20 0132 08/09/20 0132 08/09/20 1628 08/10/20 0530  WBC 16.6*  --   --  15.0*  HGB 7.4*   < > 8.5* 8.1*  HCT 21.8*   < > 24.7* 23.7*  PLT 119*  --   --  141*   < > = values in this interval not displayed.   Filed Weights   08/07/20 0749 08/11/20 0900  Weight: 88.7 kg 82.6 kg     Blood type: --/--/A POS (10/27 5852)  Rubella: Nonimmune (04/16 0000)  Vaccines: TDaP          UTD         Flu             Plan outpatient                    COVID-19 UTD   Physical Exam:  General: alert, cooperative and no distress Abdomen: soft, nontender, normal bowel sounds, + edema in panus Incision: clean, dry, intact and JP drain removed.   Staples removed and replaced with benzoin and steri strips, JPD  site closed with steri strips and gauze.  Honeycomb dressing replaced. Uterine Fundus: firm, below umbilicus, nontender Lochia: minimal Ext: no edema, redness or tenderness in the calves or thighs      Assessment/Plan: 39 y.o.   POD# 3. 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 Hemorrhage leading to DIC, has recovered from it. Transferred to [place] from ICU after observation until yesterday evening.             - Status post 2 units FFP and  1 of cryoprecipitate, no active bleeding             - continue routine post-op care Active Problems:   Preeclampsia             - normotensive, no neural sx  - f/u closely in office next week, precautions given   Rubella non-immune status, antepartum             - MMR declined   Postpartum hemorrhage - 3000cc   Maternal anemia, with delivery - ABL             - s/p 3 units PRBC initially, then repeat 1 unit PRBC yesterday, post infusion Hgb 8.5, equilibrated 8.1 this am             - asymptomatic   - S/P IV iron x 1 dose, will repeat outpatient in 1 week  - continue oral Fe and Mag ox              DC home today w/ instructions  F/U at [place] office next week Wednesday and PRN  POC in consult w/ Dr. Sharalyn Ink, CNM, MSN 08/11/2020, 10:36 AM

## 2020-08-11 NOTE — Discharge Summary (Signed)
OB Discharge Summary  Patient Name: Kristine Mcclure DOB: 1981/07/27 MRN: [id number]  Date of admission: 08/07/2020 Delivering provider: Noland Fordyce   Admitting diagnosis: Encounter for induction of labor [Z34.90] Delivery by emergency cesarean [O99.892] Intrauterine pregnancy: [redacted]w[redacted]d     Secondary diagnosis: Patient Active Problem List   Diagnosis Date Noted  . DIC syndrome ([facility]) 08/10/2020  . Postpartum hemorrhage - 3000cc 08/10/2020  . Maternal anemia, with delivery - ABL 08/10/2020  . Delivery by emergency cesarean 08/09/2020  . Postpartum care following cesarean delivery 10/28 08/08/2020  . Cesarean delivery - arrest of descent 08/08/2020  . Encounter for induction of labor 08/07/2020  . Rubella non-immune status, antepartum 11/08/2015  . History of preterm delivery--PPROM at 33 weeks, pre-eclampsia 11/08/2015  . H/O severe pre-eclampsia 11/08/2015  . Preeclampsia 07/27/2011   Additional problems:none   Date of discharge: 08/11/2020   Discharge diagnosis: Principal Problem:   Postpartum care following cesarean delivery 10/28 Active Problems:   Preeclampsia   Rubella non-immune status, antepartum   Encounter for induction of labor   Cesarean delivery - arrest of descent   Delivery by emergency cesarean   DIC syndrome ([facility])   Postpartum hemorrhage - 3000cc   Maternal anemia, with delivery - ABL                                                              Post partum procedures:blood and products transfusion, Bakri balloon, JP drain, IV iron  Augmentation: AROM, Cytotec and IP Foley Pain control: Epidural  Laceration:None  Episiotomy:None  Complications: Hemorrhage>1049mL  Hospital course:  Induction of Labor With Cesarean Section   39 y.o. yo [phone] at [redacted]w[redacted]d was admitted to the hospital 08/07/2020 for induction of labor due to [facility] and suspected LGA. During labor she was noted to have elevated transient blood pressures and her liver enzimes were elevated  form baseline as well as protein creatinine ratio. Preeclampsia without severe features was diagnosed. Patient had adequate entry into active labor.  She had slow progress around 7 to 8 cm which improved after relaxation from epidural catheter.  She got to 10 cm and pushed for 2 to 3 hours before decision was made to proceed with C-section for arrest of descent..  Membrane Rupture Time/Date: 2:55 PM ,08/07/2020   Delivery Method:C-Section, Low Transverse  Details of operation can be found in separate operative Note.   During post-op recovery patient was found to be bleeding excessively and was noted to be pale and hypotensive. In addition to giving the patient Cytotec, Hemabate, 1000 mg IV tranexamic  acid and ordering 2 units of blood patient was consented for take back to the OR for assessment of the bleeding.  Lab was called for coagulopathy panel and CBC.  The massive transfusion protocol was then initiated. Decision made to return to operating room for exploratory laparotomy. Please see operative record for details of procedure.  Patient went to ICU for intensive monitoring due to hemorrhage and DIC after surgery and during postpartum day 1 then transferred to [facility] special unit once improved. Bakri balloon placed during laparotomy was deflated slowly on post-op day 1 and removed and patient did not experience any further excessive bleeding.  Patient improved steadily throughout her postpartum stay and was able to be out of bed and ambulating  on post op day 2. Hemoglobin level was 8.1 on post-op day 2 after the 4th unit of PRBC were transfused. Liver enzymes trending down and blood pressure remained in normal range. She was given parenteral iron and started on oral iron therapy prior to discharge.    She is ambulating, tolerating a regular diet, passing flatus, and urinating well.  JP drain was removed and staples replaced with steri strips prior to discharge. Occlusive honeycomb dressing was replaced over  incision site. Patient is discharged home in stable condition on 08/11/20.    Close follow up in office on 08/14/2020 for BP and incision check.   Newborn Data: Birth date:08/08/2020  Birth time:1:16 AM  Gender:Female  Living status:Living  Apgars:9 ,9  Weight:4060 g                                 Physical exam  Vitals:   08/11/20 0435 08/11/20 0750 08/11/20 0900 08/11/20 1116  BP: (!) 121/58 129/86  138/88  Pulse: 70 86  70  Resp: 18 16  17   Temp: 97.8 F (36.6 C) 98 F (36.7 C)  97.7 F (36.5 C)  TempSrc: Oral Oral  Oral  SpO2: 99% 97%  99%  Weight:   82.6 kg   Height:       General: alert, cooperative and no distress Lochia: appropriate Uterine Fundus: firm Incision: Dressing is clean, dry, and intact DVT Evaluation: No cords or calf tenderness. No significant calf/ankle edema. Labs: CBC Latest Ref Rng & Units 08/10/2020 08/09/2020 08/09/2020  WBC 4.0 - 10.5 K/uL 15.0(H) - 16.6(H)  Hemoglobin 12.0 - 15.0 g/dL 8.1(L) 8.5(L) 7.4(L)  Hematocrit 36 - 46 % 23.7(L) 24.7(L) 21.8(L)  Platelets 150 - 400 K/uL 141(L) - 119(L)   CMP Latest Ref Rng & Units 08/09/2020 08/07/2020 11/08/2015  Glucose 70 - 99 mg/dL 11/10/2015) 77 540(J)  BUN 6 - 20 mg/dL 9 8 9   Creatinine 0.44 - 1.00 mg/dL 811(B 1.47  Sodium 135 - 145 mmol/L 138 136 136  Potassium 3.5 - 5.1 mmol/L 3.7 4.2 3.7  Chloride 98 - 111 mmol/L 108 106 106  CO2 22 - 32 mmol/L 23 21(L) 21(L)  Calcium 8.9 - 10.3 mg/dL 8.0(L) 9.4 9.0  Total Protein 6.5 - 8.1 g/dL 4.0(L) 6.1(L) 6.8  Total Bilirubin 0.3 - 1.2 mg/dL 0.4 0.6 0.4  Alkaline Phos 38 - 126 U/L 62 116 86  AST 15 - 41 U/L 32 34 33  ALT 0 - 44 U/L 28 52(H) 51   Edinburgh Postnatal Depression Scale Screening Tool 08/11/2020  I have been able to laugh and see the funny side of things. (No Data)   Vaccines: TDaP          UTD                    Flu             Plan outpatient                    COVID-19 UTD  Discharge instruction:  per After Visit Summary,   [facility] OB booklet and  "Understanding Mother & Baby Care" hospital booklet  After Visit Meds:  Allergies as of 08/11/2020   No Known Allergies     Medication List    STOP taking these medications   aspirin EC 81 MG tablet     TAKE these medications  acetaminophen 500 MG tablet Commonly known as: TYLENOL Take 2 tablets (1,000 mg total) by mouth every 6 (six) hours as needed.   coconut oil Oil Apply 1 application topically as needed.   ibuprofen 600 MG tablet Commonly known as: ADVIL Take 1 tablet (600 mg total) by mouth every 8 (eight) hours.   oxyCODONE 5 MG immediate release tablet Commonly known as: Oxy IR/ROXICODONE Take 1-2 tablets (5-10 mg total) by mouth every 4 (four) hours as needed for moderate pain.   prenatal vitamin w/FE, FA 27-1 MG Tabs tablet Take 1 tablet by mouth at bedtime.   senna-docusate 8.6-50 MG tablet Commonly known as: Senokot-S Take 2 tablets by mouth daily. Start taking on: August 12, 2020   simethicone 80 MG chewable tablet Commonly known as: MYLICON Chew 1 tablet (80 mg total) by mouth as needed for flatulence.       Diet: iron rich diet  Activity: Advance as tolerated. Pelvic rest for 6 weeks.   Postpartum contraception: Not Discussed  Newborn Data: Live born female  Birth Weight: 8 lb 15.2 oz (4060 g) APGAR: 9, 9  Newborn Delivery   Birth date/time: 08/08/2020 01:16:00 Delivery type: C-Section, Low Transverse Trial of labor: Yes C-section categorization: Primary      named Harland Dingwall Baby Feeding: Breast Disposition:home with mother   Delivery Report:  Review the Delivery Report for details.    Follow up:  Follow-up Information    Neta Mends, CNM. Schedule an appointment as soon as possible for a visit on 08/14/2020.   Specialty: Obstetrics and Gynecology Why: BP and incision check Contact information: [address] [place] [address] [phone]                 Signed: Neta Mends,  CNM, MSN 08/11/2020, 11:48 AM

## 2020-08-11 NOTE — Lactation Note (Signed)
This note was copied from a baby's chart. Lactation Consultation Note  Patient Name: Kristine Mcclure EHMCN'O Date: 08/11/2020 Reason for consult: Follow-up assessment;Term;Maternal endocrine disorder;Other (Comment) (PPH 3000 EBL) Type of Endocrine Disorder?: Diabetes  Hyperbilirubinemia   LC in to visit with P3 Mom of term baby at 35 hrs old, baby over birthweight this am.  Mom teary this am, but feels ready to go home this am.  Mom states baby is latching and doing well breastfeeding.  Mom denies any tenderness on nipples or trauma.  Baby has gotten some donor breast milk by bottle when Mom was exhausted.  Mom hesitant to use DEBP as she had an over supply with her previous babies.  Mom has pumped once.    Breasts filling, not engorged.   Encouraged keeping baby STS to encouraged frequent feedings.  Demonstrated to Mom how to assemble pump parts to make a hand pump as Mom does not have a DEBP at home.   Engorgement prevention and treatment reviewed.    Offered to make a referral to OP lactation, but Mom prefers to call if needed.  Phone numbers identified on flyer.  Mom denies any questions currently.   Interventions Interventions: Breast feeding basics reviewed;Skin to skin;Breast massage;Hand express;DEBP;Hand pump  Lactation Tools Discussed/Used Tools: Pump;Bottle Breast pump type: Double-Electric Breast Pump;Manual   Consult Status Consult Status: Complete Date: 08/11/20 Follow-up type: Call as needed    Judee Clara 08/11/2020, 8:46 AM

## 2020-08-11 NOTE — Discharge Instructions (Signed)
Lactation outpatient support - home visit ° °Linda Coppola °RN, MHA, IBCLC °at [facility]: Lactation Consultant ° °[email or web address] ° ° ° °Additional resources: ° °[facility] °https://ibconline.ca/information-sheets/ ° ° °Chiropractic specialist  ° °Dr. Leanna Hastings °[email or web address] ° °Craniosacral therapy for baby ° °Erin Balkind  °[email or web address] ° °

## 2020-08-15 ENCOUNTER — Other Ambulatory Visit: Payer: Self-pay | Admitting: Certified Nurse Midwife

## 2020-08-15 DIAGNOSIS — D65 Disseminated intravascular coagulation [defibrination syndrome]: Secondary | ICD-10-CM

## 2020-08-16 ENCOUNTER — Other Ambulatory Visit: Payer: Self-pay

## 2020-08-16 ENCOUNTER — Ambulatory Visit (HOSPITAL_COMMUNITY)
Admission: RE | Admit: 2020-08-16 | Discharge: 2020-08-16 | Disposition: A | Discharge status: Home / Self Care | Payer: Managed Care, Other (non HMO) | Source: Ambulatory Visit | Attending: Obstetrics and Gynecology | Admitting: Obstetrics and Gynecology

## 2020-08-16 DIAGNOSIS — D65 Disseminated intravascular coagulation [defibrination syndrome]: Secondary | ICD-10-CM | POA: Diagnosis present

## 2020-08-16 MED ORDER — SODIUM CHLORIDE 0.9 % IV SOLN
510.0000 mg | Freq: Once | INTRAVENOUS | Status: AC
  Administered 2020-08-16: 510 mg via INTRAVENOUS
  Filled 2020-08-16: qty 17

## 2021-02-17 ENCOUNTER — Other Ambulatory Visit: Payer: Self-pay

## 2021-02-17 ENCOUNTER — Ambulatory Visit (INDEPENDENT_AMBULATORY_CARE_PROVIDER_SITE_OTHER): Payer: Managed Care, Other (non HMO)

## 2021-02-17 ENCOUNTER — Ambulatory Visit (INDEPENDENT_AMBULATORY_CARE_PROVIDER_SITE_OTHER): Payer: Managed Care, Other (non HMO) | Admitting: Podiatry

## 2021-02-17 DIAGNOSIS — S92425A Nondisplaced fracture of distal phalanx of left great toe, initial encounter for closed fracture: Secondary | ICD-10-CM | POA: Diagnosis not present

## 2021-02-17 DIAGNOSIS — S9031XA Contusion of right foot, initial encounter: Secondary | ICD-10-CM

## 2021-02-17 MED ORDER — GENTAMICIN SULFATE 0.1 % EX CREA: 1.0000 "application " | TOPICAL_CREAM | Freq: Two times a day (BID) | CUTANEOUS | 1 refills | Status: DC

## 2021-02-24 ENCOUNTER — Ambulatory Visit (INDEPENDENT_AMBULATORY_CARE_PROVIDER_SITE_OTHER): Payer: Managed Care, Other (non HMO) | Admitting: Podiatry

## 2021-02-24 ENCOUNTER — Other Ambulatory Visit: Payer: Self-pay

## 2021-02-24 DIAGNOSIS — S92424A Nondisplaced fracture of distal phalanx of right great toe, initial encounter for closed fracture: Secondary | ICD-10-CM | POA: Diagnosis not present

## 2021-02-24 DIAGNOSIS — S91111A Laceration without foreign body of right great toe without damage to nail, initial encounter: Secondary | ICD-10-CM

## 2021-02-24 NOTE — Progress Notes (Signed)
   HPI: 40 y.o. female presenting today for follow-up evaluation of a laceration with fracture to the right great toe.  Suture was placed to urgent care.  She presents today to have the suture removed  Past Medical History:  Diagnosis Date  . Preterm delivery without spontaneous labor 08/02/2011  . Preterm labor      Physical Exam: General: The patient is alert and oriented x3 in no acute distress.  Dermatology: The laceration appears well coapted and mostly healed.  There is some slight drainage coming from the medial border of the nail plate base.  No malodor noted.  No erythema noted.  No clinical concerns for infection.  The sutures intact.  Vascular: Palpable pedal pulses bilaterally. No edema or erythema noted. Capillary refill within normal limits.  Neurological: Epicritic and protective threshold grossly intact bilaterally.   Musculoskeletal Exam: No pedal deformities noted   Assessment: 1.  Laceration with nail trauma right hallux nail plate   Plan of Care:  1. Patient evaluated.  2.  Sutures removed today.  Recommend antibiotic ointment and Band-Aid daily.  Patient may air the toe out in the evenings 3.  Continue weightbearing in the postsurgical shoe 4.  Return to clinic in 3 weeks for follow-up x-ray      Felecia Shelling, DPM [facility]  Dr. Felecia Shelling, DPM    [address] [place], [place] [address]                Office [phone]  Fax [phone]

## 2021-03-04 NOTE — Progress Notes (Signed)
   HPI: 40 y.o. female presenting today as a new patient for evaluation of a toe fracture the patient sustained on 02/10/2021 in [place].  Patient went to an urgent care center where she was diagnosed with a fracture of the left great toe as well as a laceration.  The suture was applied.  She presents today for further treatment and evaluation  Past Medical History:  Diagnosis Date  . Preterm delivery without spontaneous labor 08/02/2011  . Preterm labor      Physical Exam: General: The patient is alert and oriented x3 in no acute distress.  Dermatology: Skin is warm, dry and supple bilateral lower extremities.  Laceration noted across the proximal portion of the distal phalanx just proximal to the nailbed left great toe.  There is a suture applied and the laceration appears well coapted.  Vascular: Palpable pedal pulses bilaterally. No edema or erythema noted. Capillary refill within normal limits.  Neurological: Epicritic and protective threshold grossly intact bilaterally.   Musculoskeletal Exam: No pedal deformities noted  Radiographic Exam:  Normal osseous mineralization. Joint spaces preserved.  Closed, nondisplaced fracture noted to the distal phalanx left great toe  Assessment: 1.  Laceration left hallux with sutures intact 2.  Fracture distal phalanx left great toe, closed, nondisplaced.  DOI: 02/10/2021 in [place]   Plan of Care:  1. Patient evaluated. X-Rays reviewed.  2.  Explained to the patient that the fracture should heal with time. 3.  Prescription for gentamicin cream applied daily to the area with a light dressing 4.  Return to clinic in 1 week for suture removal      Felecia Shelling, DPM [facility]  Dr. Felecia Shelling, DPM    [address] [place], [place] [address]                Office [phone]  Fax [phone]

## 2021-03-17 ENCOUNTER — Other Ambulatory Visit: Payer: Self-pay

## 2021-03-17 ENCOUNTER — Ambulatory Visit (INDEPENDENT_AMBULATORY_CARE_PROVIDER_SITE_OTHER): Payer: Managed Care, Other (non HMO)

## 2021-03-17 ENCOUNTER — Ambulatory Visit (INDEPENDENT_AMBULATORY_CARE_PROVIDER_SITE_OTHER): Payer: Managed Care, Other (non HMO) | Admitting: Podiatry

## 2021-03-17 DIAGNOSIS — S91111A Laceration without foreign body of right great toe without damage to nail, initial encounter: Secondary | ICD-10-CM

## 2021-03-17 DIAGNOSIS — S92424A Nondisplaced fracture of distal phalanx of right great toe, initial encounter for closed fracture: Secondary | ICD-10-CM

## 2021-03-17 NOTE — Progress Notes (Signed)
   HPI: 40 y.o. female presenting today for follow-up evaluation of a laceration with fracture to the right great toe.  Overall patient states she is doing much better.  Things are normalizing and the pain has subsided significantly.  No new complaints at this time  Past Medical History:  Diagnosis Date  . Preterm delivery without spontaneous labor 08/02/2011  . Preterm labor      Physical Exam: General: The patient is alert and oriented x3 in no acute distress.  Dermatology: The laceration appears well coapted and healed.  Negative for any drainage.  Complete reepithelialization has occurred.  No open wounds noted no erythema noted.  No clinical concerns for infection.    Vascular: Palpable pedal pulses bilaterally. No edema or erythema noted. Capillary refill within normal limits.  Neurological: Epicritic and protective threshold grossly intact bilaterally.   Musculoskeletal Exam: No pedal deformities noted  Radiographic exam: Fracture still visible to the distal phalanx of the right great toe.  Nondisplaced.  There does appear to be some bone resorption along the fracture line which is normal.  Overall good alignment and no significant changes since prior x-rays   Assessment: 1.  Laceration with nail trauma right hallux nail plate 2.  Fracture distal phalanx right great toe   Plan of Care:  1. Patient evaluated.  2.  Patient may slowly transition out of postsurgical shoe into good supportive sneakers 3.  Silicone toe cap was provided to cushion the toe, especially in close toed shoes 4.  I did explain to the patient that if the toenail becomes problematic or very loosely adhered, we will likely need to proceed with total temporary nail avulsion to allow the new nail to grow in 5.  Return to clinic in 6 weeks for follow-up x-ray  *Going to [place] with family on a river[place] trip     Felecia Shelling, [place] [facility]  Dr. Felecia Shelling, DPM    [address]  [place], [place] [address]                Office [phone]  Fax [phone]

## 2021-04-28 ENCOUNTER — Ambulatory Visit: Payer: Managed Care, Other (non HMO) | Admitting: Podiatry

## 2021-05-12 ENCOUNTER — Other Ambulatory Visit: Payer: Self-pay | Admitting: Podiatry

## 2021-05-12 ENCOUNTER — Other Ambulatory Visit: Payer: Self-pay

## 2021-05-12 ENCOUNTER — Ambulatory Visit (INDEPENDENT_AMBULATORY_CARE_PROVIDER_SITE_OTHER): Payer: Managed Care, Other (non HMO) | Admitting: Podiatry

## 2021-05-12 ENCOUNTER — Ambulatory Visit (INDEPENDENT_AMBULATORY_CARE_PROVIDER_SITE_OTHER): Payer: Managed Care, Other (non HMO)

## 2021-05-12 DIAGNOSIS — S92425A Nondisplaced fracture of distal phalanx of left great toe, initial encounter for closed fracture: Secondary | ICD-10-CM

## 2021-05-12 DIAGNOSIS — S92424D Nondisplaced fracture of distal phalanx of right great toe, subsequent encounter for fracture with routine healing: Secondary | ICD-10-CM

## 2021-05-12 NOTE — Progress Notes (Signed)
   HPI: 40 y.o. female presenting today for follow-up evaluation of a laceration with fracture to the right great toe.  Overall patient states she is doing much better.  Things are normalizing and the pain has subsided significantly.  No new complaints at this time  Past Medical History:  Diagnosis Date   Preterm delivery without spontaneous labor 08/02/2011   Preterm labor      Physical Exam: General: The patient is alert and oriented x3 in no acute distress.  Dermatology: The laceration appears well coapted and healed.  Negative for any drainage.  Complete reepithelialization has occurred.  No open wounds noted no erythema noted.  No clinical concerns for infection.    Vascular: Palpable pedal pulses bilaterally. No edema or erythema noted. Capillary refill within normal limits.  Neurological: Epicritic and protective threshold grossly intact bilaterally.   Musculoskeletal Exam: No pedal deformities noted  Radiographic exam: Fracture still visible to the distal phalanx of the right great toe.  Nondisplaced.  There does appear to be some bone healing along the fracture line which is normal.  Overall good alignment and no significant changes since prior x-rays   Assessment: 1.  Laceration with nail trauma right hallux nail plate 2.  Fracture distal phalanx right great toe   Plan of Care:  1. Patient evaluated.  2.  Overall the is good healing of the fracture and the toes mostly asymptomatic.  Patient may slowly increase to full activity no restrictions. 3.  Recommend good supportive shoe gear 4.  Return to clinic as needed     Felecia Shelling, DPM [facility]  Dr. Felecia Shelling, DPM    [address] [place], [place] [address]                Office [phone]  Fax [phone]

## 2021-12-17 ENCOUNTER — Ambulatory Visit (INDEPENDENT_AMBULATORY_CARE_PROVIDER_SITE_OTHER): Payer: Managed Care, Other (non HMO) | Admitting: Family

## 2021-12-17 ENCOUNTER — Encounter: Payer: Self-pay | Admitting: Family

## 2021-12-17 VITALS — BP 99/66 | HR 75 | Temp 97.7°F | Ht 62.0 in | Wt 190.0 lb

## 2021-12-17 DIAGNOSIS — Z862 Personal history of diseases of the blood and blood-forming organs and certain disorders involving the immune mechanism: Secondary | ICD-10-CM | POA: Insufficient documentation

## 2021-12-17 DIAGNOSIS — L309 Dermatitis, unspecified: Secondary | ICD-10-CM

## 2021-12-17 HISTORY — DX: Personal history of diseases of the blood and blood-forming organs and certain disorders involving the immune mechanism: Z86.2

## 2021-12-17 LAB — CBC WITH DIFFERENTIAL/PLATELET
Basophils Absolute: 0.1 10*3/uL (ref 0.0–0.1)
Basophils Relative: 1 % (ref 0.0–3.0)
Eosinophils Absolute: 0.1 10*3/uL (ref 0.0–0.7)
Eosinophils Relative: 1.8 % (ref 0.0–5.0)
HCT: 40.3 % (ref 36.0–46.0)
Hemoglobin: 13.4 g/dL (ref 12.0–15.0)
Lymphocytes Relative: 31.3 % (ref 12.0–46.0)
Lymphs Abs: 2.2 10*3/uL (ref 0.7–4.0)
MCHC: 33.2 g/dL (ref 30.0–36.0)
MCV: 86.6 fl (ref 78.0–100.0)
Monocytes Absolute: 0.5 10*3/uL (ref 0.1–1.0)
Monocytes Relative: 7.7 % (ref 3.0–12.0)
Neutro Abs: 4.1 10*3/uL (ref 1.4–7.7)
Neutrophils Relative %: 58.2 % (ref 43.0–77.0)
Platelets: 260 10*3/uL (ref 150.0–400.0)
RBC: 4.65 Mil/uL (ref 3.87–5.11)
RDW: 13.4 % (ref 11.5–15.5)
WBC: 7 10*3/uL (ref 4.0–10.5)

## 2021-12-17 MED ORDER — TRIAMCINOLONE ACETONIDE 0.5 % EX OINT: 1.0000 "application " | TOPICAL_OINTMENT | Freq: Two times a day (BID) | CUTANEOUS | 0 refills | Status: DC

## 2021-12-17 NOTE — Progress Notes (Signed)
? ?New Patient Office Visit ? ?Subjective:  ?Patient ID: Kristine Mcclure, female    DOB: 1981-05-14  Age: 41 y.o. MRN: [id number] ? ?CC:  ?Chief Complaint  ?Patient presents with  ? Establish Care  ?  Labs  ? ? ?HPI ?Kristine Mcclure presents for establishing care and to discuss a couple of problems. ?Rash: Patient complains of rash involving the bilateral lower leg. Rash started  months ago. Appearance of rash at onset: Color of lesion(s): red, Other appearance: streaky. Rash has changed over time Initial distribution:  back of legs .  Discomfort associated with rash: is pruritic.  Associated symptoms: none. Patient has had previous evaluation of rash. Patient has had previous treatment.  Response to treatment: mild improvement. Patient has not had contacts with similar rash. Patient has not identified precipitant. Patient has not had new exposures (soaps, lotions, laundry detergents, foods, medications, plants, insects or animals.)  ?History of Anemia:  pt reports having anemia when she was pregnant, just under 2 years ago, and has not had any labs since and wanting to be sure her levels are back to normal. Denies any sx, no bleeding, no menstrual cycle as pt is still breast feeding. ? ?Past Medical History:  ?Diagnosis Date  ? Blood transfusion without reported diagnosis 08/08/2020  ? Preterm delivery without spontaneous labor 08/02/2011  ? Preterm labor   ? ? ?Past Surgical History:  ?Procedure Laterality Date  ? CESAREAN SECTION N/A 08/08/2020  ? Procedure: CESAREAN SECTION;  Surgeon: Aloha Gell, MD;  Location: [facility];  Service: Obstetrics;  Laterality: N/A;  ? DILATION AND EVACUATION N/A 08/08/2020  ? Procedure: DILATATION AND EVACUATION;  Surgeon: Aloha Gell, MD;  Location: [facility];  Service: Gynecology;  Laterality: N/A;  ? LAPAROTOMY N/A 08/08/2020  ? Procedure: EXPLORATORY LAPAROTOMY;  Surgeon: Aloha Gell, MD;  Location: [facility];  Service: Gynecology;  Laterality: N/A;  ? NO  PAST SURGERIES    ? WISDOM TOOTH EXTRACTION    ? ? ?Family History  ?Problem Relation Age of Onset  ? Heart disease Maternal Grandfather   ? Diabetes Maternal Grandfather   ? Cancer Maternal Grandfather   ? Hearing loss Maternal Grandfather   ? Diabetes Paternal Grandmother   ? Diabetes Paternal Grandfather   ? Cancer Paternal Grandfather   ? Hearing loss Maternal Grandmother   ? ? ?Social History  ? ?Socioeconomic History  ? Marital status: Married  ?  Spouse name: Not on file  ? Number of children: Not on file  ? Years of education: Not on file  ? Highest education level: Not on file  ?Occupational History  ? Not on file  ?Tobacco Use  ? Smoking status: Never  ? Smokeless tobacco: Never  ?Vaping Use  ? Vaping Use: Never used  ?Substance and Sexual Activity  ? Alcohol use: No  ? Drug use: No  ? Sexual activity: Yes  ?  Birth control/protection: None  ?Other Topics Concern  ? Not on file  ?Social History Narrative  ? Not on file  ? ?Social Determinants of Health  ? ?Financial Resource Strain: Not on file  ?Food Insecurity: Not on file  ?Transportation Needs: Not on file  ?Physical Activity: Not on file  ?Stress: Not on file  ?Social Connections: Not on file  ?Intimate Partner Violence: Not on file  ? ? ?Objective:  ? ?Today's Vitals: BP 99/66 (BP Location: Left Arm, Patient Position: Sitting, Cuff Size: Large)   Pulse 75  Temp 97.7 ?F (36.5 ?C) (Temporal)   Ht 5\' 2"  (1.575 m)   Wt 190 lb (86.2 kg)   LMP 10/25/2019 (Approximate)   SpO2 94%   Breastfeeding Yes   BMI 34.75 kg/m?  ? ?Physical Exam ?Vitals and nursing note reviewed.  ?Constitutional:   ?   Appearance: Normal appearance.  ?Cardiovascular:  ?   Rate and Rhythm: Normal rate and regular rhythm.  ?Pulmonary:  ?   Effort: Pulmonary effort is normal.  ?   Breath sounds: Normal breath sounds.  ?Musculoskeletal:     ?   General: Normal range of motion.  ?Skin: ?   General: Skin is warm and dry.  ?   Findings: Rash present. Rash is urticarial (anterior  bilateral lower legs).  ?Neurological:  ?   Mental Status: She is alert.  ?Psychiatric:     ?   Mood and Affect: Mood normal.     ?   Behavior: Behavior normal.  ? ? ?Assessment & Plan:  ? ?Problem List Items Addressed This Visit   ? ?  ? Other  ? History of anemia - Primary  ?  while pregnant, no sx, never had labs to see if resolved, will run today. ?  ?  ? Relevant Orders  ? CBC with Differential/Platelet  ? ?Other Visit Diagnoses   ? ? Dermatitis      ? Relevant Medications  ? triamcinolone ointment (KENALOG) 0.5 %  ? ?  ? ? ?Outpatient Encounter Medications as of 12/17/2021  ?Medication Sig  ? ibuprofen (ADVIL) 600 MG tablet Take 1 tablet (600 mg total) by mouth every 8 (eight) hours. (Patient taking differently: Take 600 mg by mouth as needed.)  ? prenatal vitamin w/FE, FA (PRENATAL 1 + 1) 27-1 MG TABS Take 1 tablet by mouth at bedtime.   ? triamcinolone cream (KENALOG) 0.1 % Apply 1 application. topically 2 (two) times daily.  ? triamcinolone ointment (KENALOG) 0.5 % Apply 1 application. topically 2 (two) times daily.  ? [DISCONTINUED] coconut oil OIL Apply 1 application topically as needed.  ? [DISCONTINUED] gentamicin cream (GARAMYCIN) 0.1 % Apply 1 application topically 2 (two) times daily.  ? [DISCONTINUED] oxyCODONE (OXY IR/ROXICODONE) 5 MG immediate release tablet Take 1-2 tablets (5-10 mg total) by mouth every 4 (four) hours as needed for moderate pain.  ? [DISCONTINUED] Prenatal Vit-Fe Fumarate-FA (PRENATAL 1+1 PO)   ? [DISCONTINUED] senna-docusate (SENOKOT-S) 8.6-50 MG tablet Take 2 tablets by mouth daily.  ? [DISCONTINUED] simethicone (MYLICON) 80 MG chewable tablet Chew 1 tablet (80 mg total) by mouth as needed for flatulence.  ? ?No facility-administered encounter medications on file as of 12/17/2021.  ? ? ?Follow-up: Return for Complete physical w/fasting labs.  ? ?Jeanie Sewer, NP ?

## 2021-12-17 NOTE — Patient Instructions (Signed)
Welcome to [facility]! It was a pleasure meeting you today. ? ?Go to the lab for blood work today. ?As discussed, I have sent over a stronger steroid ointment to use on your rash. Apply a thick cream over this twice a day. Try to avoid wearing tight fitting pants and see if this helps the rash. ? ?Please schedule a physical with fasting labs at your convenience today. ? ? ? ? ?PLEASE NOTE: ? ?If you had any LAB tests please let us know if you have not heard back within a few days. You may see your results on MyChart before we have a chance to review them but we will give you a call once they are reviewed by [place]. If we ordered any REFERRALS today, please let us know if you have not heard from their office within the next week.  ?Let us know through MyChart if you are needing REFILLS, or have your pharmacy send [place] the request. You can also use MyChart to communicate with me or any office staff. ? ?Please try these tips to maintain a healthy lifestyle: ? ?Eat most of your calories during the day when you are active. Eliminate processed foods including packaged sweets (pies, cakes, cookies), reduce intake of potatoes, white bread, white pasta, and white rice. Look for whole grain options, oat flour or almond flour. ? ?Each meal should contain half fruits/vegetables, one quarter protein, and one quarter carbs (no bigger than a computer mouse). ? ?Cut down on sweet beverages. This includes juice, soda, and sweet tea. Also watch fruit intake, though this is a healthier sweet option, it still contains natural sugar! Limit to 3 servings daily. ? ?Drink at least 1 glass of water with each meal and aim for at least 8 glasses per day ? ?Exercise at least 150 minutes every week.  ? ?

## 2021-12-17 NOTE — Assessment & Plan Note (Signed)
while pregnant, no sx, never had labs to see if resolved, will run today. ?

## 2021-12-19 ENCOUNTER — Other Ambulatory Visit: Payer: Managed Care, Other (non HMO)

## 2022-02-16 ENCOUNTER — Encounter: Payer: Self-pay | Admitting: Family

## 2022-02-16 ENCOUNTER — Ambulatory Visit (INDEPENDENT_AMBULATORY_CARE_PROVIDER_SITE_OTHER): Payer: Commercial Managed Care - PPO | Admitting: Family

## 2022-02-16 VITALS — BP 118/80 | HR 72 | Temp 98.0°F | Ht 62.0 in | Wt 190.5 lb

## 2022-02-16 DIAGNOSIS — R0781 Pleurodynia: Secondary | ICD-10-CM | POA: Diagnosis not present

## 2022-02-16 NOTE — Patient Instructions (Addendum)
It was very nice to see you today! ? ?As discussed, take up to 4 ibuprofen every 8 hours after eating for the next week.  You can alternate this with 2 extra strength Tylenol 3-4 times a day.  Apply ice or heat up to 30 minutes at a time 3 times a day. ?Avoid any strenuous activity for the next week to 2 weeks, including reaching, stretching, or lifting more than 10 or 15 pounds.  See the attached handout for more tips in your recovery. ? ?Hope you enjoy your upcoming cruise! ? ? ? ? ?PLEASE NOTE: ? ?If you had any lab tests please let us know if you have not heard back within a few days. You may see your results on MyChart before we have a chance to review them but we will give you a call once they are reviewed by [place]. If we ordered any referrals today, please let us know if you have not heard from their office within the next week.  ? ?

## 2022-02-16 NOTE — Progress Notes (Signed)
? ?Subjective:  ? ? ? Patient ID: Kristine Mcclure, female    DOB: March 17, 1981, 41 y.o.   MRN: [id number] ? ?Chief Complaint  ?Patient presents with  ? Pain  ?  Pain on left side under breast, top of rib cage radiating to back, possible pulled muscle, hurts to inhale deeply ?Pain started Saturday evening ?Took acetaminophen at 8 am  ? ?HPI: ?Pain ?She reports new onset left rib pain. There was not an injury that may have caused the pain. The pain started  2 days ago and is staying constant. The pain does not radiate. The pain is described as sharp and soreness, is moderate in intensity, occurring intermittently. Symptoms are worse in the: any time . ?Aggravating factors: bending backwards, bending forwards, bending sideways, increased intrathoracic pressure (cough, sneeze, etc.), and recumbency She has tried acetaminophen with mild relief.  ? ? ?Assessment & Plan:  ? ?Problem List Items Addressed This Visit   ?None ?Visit Diagnoses   ? ? Rib pain on left side    -  Primary ?denies any injury, she had been running and then lifted her 34mo old and thinks that caused the pain. Denies SOB, but feels pain with deep breath.advised pt to take up to 4 ibuprofen every 8 hours after eating for the next week. alternate this with 2 extra strength Tylenol 3-4 times a day.  Apply ice or heat up to 30 minutes at a time 3 times a day. Avoid any strenuous activity for the next week to 2 weeks, including reaching, stretching, or lifting more than 10 or 15 pounds.  ? ?  ? ?Outpatient Medications Prior to Visit  ?Medication Sig Dispense Refill  ? prenatal vitamin w/FE, FA (PRENATAL 1 + 1) 27-1 MG TABS Take 1 tablet by mouth at bedtime.     ? triamcinolone cream (KENALOG) 0.1 % Apply 1 application. topically 2 (two) times daily.    ? triamcinolone ointment (KENALOG) 0.5 % Apply 1 application. topically 2 (two) times daily. (Patient not taking: Reported on 02/16/2022) 30 g 0  ? ?No facility-administered medications prior to visit.   ? ? ?Past Medical History:  ?Diagnosis Date  ? Blood transfusion without reported diagnosis 08/08/2020  ? Preterm delivery without spontaneous labor 08/02/2011  ? Preterm labor   ? ? ?Past Surgical History:  ?Procedure Laterality Date  ? CESAREAN SECTION N/A 08/08/2020  ? Procedure: CESAREAN SECTION;  Surgeon: Noland Fordyce, MD;  Location: [facility];  Service: Obstetrics;  Laterality: N/A;  ? DILATION AND EVACUATION N/A 08/08/2020  ? Procedure: DILATATION AND EVACUATION;  Surgeon: Noland Fordyce, MD;  Location: [facility];  Service: Gynecology;  Laterality: N/A;  ? LAPAROTOMY N/A 08/08/2020  ? Procedure: EXPLORATORY LAPAROTOMY;  Surgeon: Noland Fordyce, MD;  Location: [facility];  Service: Gynecology;  Laterality: N/A;  ? NO PAST SURGERIES    ? WISDOM TOOTH EXTRACTION    ? ? ?No Known Allergies ? ?   ?Objective:  ?  ?Physical Exam ?Vitals and nursing note reviewed.  ?Constitutional:   ?   Appearance: Normal appearance.  ?Cardiovascular:  ?   Rate and Rhythm: Normal rate and regular rhythm.  ?Pulmonary:  ?   Effort: Pulmonary effort is normal.  ?   Breath sounds: Normal breath sounds.  ?Musculoskeletal:  ?   Thoracic back: No swelling or signs of trauma. Decreased range of motion (left rib cage).  ?Skin: ?   General: Skin is warm and dry.  ?Neurological:  ?  Mental Status: She is alert.  ?Psychiatric:     ?   Mood and Affect: Mood normal.     ?   Behavior: Behavior normal.  ? ? ?BP 118/80   Pulse 72   Temp 98 ?F (36.7 ?C) (Temporal)   Ht 5\' 2"  (1.575 m)   Wt 190 lb 8 oz (86.4 kg)   SpO2 98%   Breastfeeding Yes   BMI 34.84 kg/m?  ?Wt Readings from Last 3 Encounters:  ?02/16/22 190 lb 8 oz (86.4 kg)  ?12/17/21 190 lb (86.2 kg)  ?08/11/20 182 lb (82.6 kg)  ? ? ?08/13/20, NP ? ?

## 2022-03-27 ENCOUNTER — Ambulatory Visit (INDEPENDENT_AMBULATORY_CARE_PROVIDER_SITE_OTHER): Payer: Commercial Managed Care - PPO | Admitting: Physician Assistant

## 2022-03-27 VITALS — BP 136/87 | HR 77 | Temp 98.5°F | Ht 62.0 in | Wt 188.8 lb

## 2022-03-27 DIAGNOSIS — R509 Fever, unspecified: Secondary | ICD-10-CM

## 2022-03-27 DIAGNOSIS — H6692 Otitis media, unspecified, left ear: Secondary | ICD-10-CM

## 2022-03-27 LAB — POC COVID19 BINAXNOW: SARS Coronavirus 2 Ag: NEGATIVE

## 2022-03-27 MED ORDER — AMOXICILLIN 500 MG PO CAPS: 1000.0000 mg | ORAL_CAPSULE | Freq: Three times a day (TID) | ORAL | 0 refills | Status: DC

## 2022-03-27 MED ORDER — BENZONATATE 100 MG PO CAPS: 100.0000 mg | ORAL_CAPSULE | Freq: Three times a day (TID) | ORAL | 0 refills | Status: DC | PRN

## 2022-03-27 NOTE — Progress Notes (Signed)
Subjective:    Patient ID: Kristine Mcclure, female    DOB: Feb 08, 1981, 41 y.o.   MRN: [id number]  Chief Complaint  Patient presents with   Cough    Pt c/o cough, ear pain/pressure; hearing loss; sinus pressure and headache. Pt not taken a Covid test and states feels worse than she did with Covid; daughter had viral conjunctivitis. Been having symptoms since Sunday, pt ran fever but not in 24 hrs; taking Ibuprofen.       Patient is in today for the following:  Chief complaint: Cough, sinus pressure Symptom onset: 5 days ago 03/22/22 Pertinent positives: L ear pain, fever, chills, fatigue Pertinent negatives: SOB/ CP, N/v/d, abd pain, rash, pink eyes Treatments tried: Tylenol / ibuprofen (she is breastfeeding) Sick exposure: Daughter with viral conjunctivitis & ST, then son with ST and congestion   Past Medical History:  Diagnosis Date   Blood transfusion without reported diagnosis 08/08/2020   Preterm delivery without spontaneous labor 08/02/2011   Preterm labor     Past Surgical History:  Procedure Laterality Date   CESAREAN SECTION N/A 08/08/2020   Procedure: CESAREAN SECTION;  Surgeon: Noland Fordyce, MD;  Location: [facility];  Service: Obstetrics;  Laterality: N/A;   DILATION AND EVACUATION N/A 08/08/2020   Procedure: DILATATION AND EVACUATION;  Surgeon: Noland Fordyce, MD;  Location: [facility];  Service: Gynecology;  Laterality: N/A;   LAPAROTOMY N/A 08/08/2020   Procedure: EXPLORATORY LAPAROTOMY;  Surgeon: Noland Fordyce, MD;  Location: [facility];  Service: Gynecology;  Laterality: N/A;   NO PAST SURGERIES     WISDOM TOOTH EXTRACTION      Family History  Problem Relation Age of Onset   Heart disease Maternal Grandfather    Diabetes Maternal Grandfather    Cancer Maternal Grandfather    Hearing loss Maternal Grandfather    Diabetes Paternal Grandmother    Diabetes Paternal Grandfather    Cancer Paternal Grandfather    Hearing loss Maternal Grandmother      Social History   Tobacco Use   Smoking status: Never   Smokeless tobacco: Never  Vaping Use   Vaping Use: Never used  Substance Use Topics   Alcohol use: No   Drug use: No     No Known Allergies  Review of Systems  Respiratory:  Positive for cough.    NEGATIVE UNLESS OTHERWISE INDICATED IN HPI      Objective:     BP 136/87 (BP Location: Left Arm)   Pulse 77   Temp 98.5 F (36.9 C) (Temporal)   Ht 5\' 2"  (1.575 m)   Wt 188 lb 12.8 oz (85.6 kg)   LMP 03/04/2022 (Exact Date)   SpO2 97%   Breastfeeding Yes   BMI 34.53 kg/m   Wt Readings from Last 3 Encounters:  03/27/22 188 lb 12.8 oz (85.6 kg)  02/16/22 190 lb 8 oz (86.4 kg)  12/17/21 190 lb (86.2 kg)    BP Readings from Last 3 Encounters:  03/27/22 136/87  02/16/22 118/80  12/17/21 99/66     Physical Exam Vitals and nursing note reviewed.  Constitutional:      General: She is not in acute distress.    Appearance: Normal appearance. She is not ill-appearing.  HENT:     Head: Normocephalic.     Right Ear: Tympanic membrane, ear canal and external ear normal.     Left Ear: Ear canal and external ear normal. Tympanic membrane is erythematous and bulging.  Nose: Congestion present.     Mouth/Throat:     Mouth: Mucous membranes are moist.     Pharynx: No oropharyngeal exudate or posterior oropharyngeal erythema.  Eyes:     Extraocular Movements: Extraocular movements intact.     Conjunctiva/sclera: Conjunctivae normal.     Pupils: Pupils are equal, round, and reactive to light.  Cardiovascular:     Rate and Rhythm: Normal rate and regular rhythm.     Pulses: Normal pulses.     Heart sounds: Normal heart sounds. No murmur heard. Pulmonary:     Effort: Pulmonary effort is normal. No respiratory distress.     Breath sounds: Normal breath sounds. No wheezing.  Musculoskeletal:     Cervical back: Normal range of motion.  Skin:    General: Skin is warm.  Neurological:     Mental Status: She is  alert and oriented to person, place, and time.  Psychiatric:        Mood and Affect: Mood normal.        Behavior: Behavior normal.        Assessment & Plan:   Problem List Items Addressed This Visit   None Visit Diagnoses     Fever, unspecified fever cause    -  Primary   Relevant Orders   POC COVID-19 (Completed)   Acute left otitis media       Relevant Medications   amoxicillin (AMOXIL) 500 MG capsule        Meds ordered this encounter  Medications   amoxicillin (AMOXIL) 500 MG capsule    Sig: Take 2 capsules (1,000 mg total) by mouth 3 (three) times daily for 7 days.    Dispense:  42 capsule    Refill:  0    Order Specific Question:   Supervising Provider    Answer:   Tana Conch O [[id number]]   1. Fever, unspecified fever cause 2. Acute left otitis media Likely viral infection as this spread through household so quickly, now with L OM. COVID-19 neg. Could be adenovirus as this is going around. Start amoxil as directed. Tylenol / motrin, fluids, rest Recheck prn    This note was prepared with assistance of Dragon voice recognition software. Occasional wrong-word or sound-a-like substitutions may have occurred due to the inherent limitations of voice recognition software.    Andersyn Fragoso M Julena Barbour, PA-C

## 2022-04-02 ENCOUNTER — Ambulatory Visit (INDEPENDENT_AMBULATORY_CARE_PROVIDER_SITE_OTHER): Payer: Commercial Managed Care - PPO | Admitting: Family

## 2022-04-02 ENCOUNTER — Encounter: Payer: Self-pay | Admitting: Family

## 2022-04-02 VITALS — BP 119/81 | HR 100 | Temp 98.4°F | Ht 62.0 in | Wt 191.2 lb

## 2022-04-02 DIAGNOSIS — H6692 Otitis media, unspecified, left ear: Secondary | ICD-10-CM | POA: Diagnosis not present

## 2022-04-02 MED ORDER — AMOXICILLIN-POT CLAVULANATE 875-125 MG PO TABS: 1.0000 | ORAL_TABLET | Freq: Two times a day (BID) | ORAL | 0 refills | Status: DC

## 2022-04-02 NOTE — Progress Notes (Signed)
Subjective:     Patient ID: Keith Rake, female    DOB: [birth year], 41 y.o.   MRN: [id number]  Chief Complaint  Patient presents with   Ear Pain    Pt c/o left ear pain for about a week and also reduced hearing. Pt had a ear infection on 6/16 in left ear. Currently is on antibiotics and taking tylenol for pain.    HPI: Ear pain - pt describes a 1 week history of Left ear pain with fullness, hearing loss. Denies associated discharge, or dizziness, or fever. pt seen in office by different provider and given AMOX, has taken for 6 days, but ear still has some pain, fullness, and decreased hearing.   Assessment & Plan:   Problem List Items Addressed This Visit   None Visit Diagnoses     Acute left otitis media    -  Primary Seen a week ago by a different provider, and started on amoxicillin, on day 5 and and ear is still with erythema and bulging, mild drainage, and pain.  Starting Augmentin, advised on use and side effects, stopping amoxicillin, advised patient to take 1 dose of Augmentin today.  Okay to take Tylenol or ibuprofen as needed for pain.    Relevant Medications   amoxicillin-clavulanate (AUGMENTIN) 875-125 MG tablet      Outpatient Medications Prior to Visit  Medication Sig Dispense Refill   benzonatate (TESSALON PERLES) 100 MG capsule Take 1 capsule (100 mg total) by mouth 3 (three) times daily as needed for cough. 20 capsule 0   prenatal vitamin w/FE, FA (PRENATAL 1 + 1) 27-1 MG TABS Take 1 tablet by mouth at bedtime.      triamcinolone cream (KENALOG) 0.1 % Apply 1 application. topically 2 (two) times daily.     amoxicillin (AMOXIL) 500 MG capsule Take 2 capsules (1,000 mg total) by mouth 3 (three) times daily for 7 days. 42 capsule 0   triamcinolone ointment (KENALOG) 0.5 % Apply 1 application. topically 2 (two) times daily. 30 g 0   No facility-administered medications prior to visit.    Past Medical History:  Diagnosis Date   Blood transfusion without  reported diagnosis 08/08/2020   Preterm delivery without spontaneous labor 08/02/2011   Preterm labor     Past Surgical History:  Procedure Laterality Date   CESAREAN SECTION N/A 08/08/2020   Procedure: CESAREAN SECTION;  Surgeon: Noland Fordyce, MD;  Location: [facility];  Service: Obstetrics;  Laterality: N/A;   DILATION AND EVACUATION N/A 08/08/2020   Procedure: DILATATION AND EVACUATION;  Surgeon: Noland Fordyce, MD;  Location: [facility];  Service: Gynecology;  Laterality: N/A;   LAPAROTOMY N/A 08/08/2020   Procedure: EXPLORATORY LAPAROTOMY;  Surgeon: Noland Fordyce, MD;  Location: [facility];  Service: Gynecology;  Laterality: N/A;   NO PAST SURGERIES     WISDOM TOOTH EXTRACTION      No Known Allergies     Objective:    Physical Exam Vitals and nursing note reviewed.  Constitutional:      Appearance: Normal appearance.  HENT:     Right Ear: Tympanic membrane and ear canal normal.     Left Ear: External ear normal. Decreased hearing noted. Drainage present. Tympanic membrane is injected, erythematous and bulging.  Cardiovascular:     Rate and Rhythm: Normal rate and regular rhythm.  Pulmonary:     Effort: Pulmonary effort is normal.     Breath sounds: Normal breath sounds.  Musculoskeletal:  General: Normal range of motion.  Skin:    General: Skin is warm and dry.  Neurological:     Mental Status: She is alert.  Psychiatric:        Mood and Affect: Mood normal.        Behavior: Behavior normal.     BP 119/81 (BP Location: Left Arm, Patient Position: Sitting, Cuff Size: Large)   Pulse 100   Temp 98.4 F (36.9 C) (Temporal)   Ht 5\' 2"  (1.575 m)   Wt 191 lb 4 oz (86.8 kg)   LMP 03/04/2022 (Exact Date)   SpO2 95%   Breastfeeding Yes   BMI 34.98 kg/m  Wt Readings from Last 3 Encounters:  04/02/22 191 lb 4 oz (86.8 kg)  03/27/22 188 lb 12.8 oz (85.6 kg)  02/16/22 190 lb 8 oz (86.4 kg)        Meds ordered this encounter  Medications    amoxicillin-clavulanate (AUGMENTIN) 875-125 MG tablet    Sig: Take 1 tablet by mouth 2 (two) times daily.    Dispense:  14 tablet    Refill:  0    d/c AMOX    Order Specific Question:   Supervising Provider    Answer:   ANDY, CAMILLE L [[id number]]    04/18/22, NP

## 2022-04-02 NOTE — Patient Instructions (Signed)
It was very nice to see you today!   Stop taking the Amoxicillin. I have sent over Augmentin to start - take 1 dose today and then twice a day starting tomorrow for 1 week, be sure to eat something first, and okay to take an over-the-counter probiotic to help prevent possible diarrhea. Okay to continue Tylenol or ibuprofen 3 times a day for pain.     PLEASE NOTE:  If you had any lab tests please let us know if you have not heard back within a few days. You may see your results on MyChart before we have a chance to review them but we will give you a call once they are reviewed by [place]. If we ordered any referrals today, please let us know if you have not heard from their office within the next week.

## 2022-04-13 ENCOUNTER — Ambulatory Visit (INDEPENDENT_AMBULATORY_CARE_PROVIDER_SITE_OTHER): Payer: Commercial Managed Care - PPO | Admitting: Family

## 2022-04-13 ENCOUNTER — Encounter: Payer: Self-pay | Admitting: Family

## 2022-04-13 VITALS — BP 124/90 | HR 74 | Temp 98.1°F | Ht 62.0 in | Wt 189.5 lb

## 2022-04-13 DIAGNOSIS — H6692 Otitis media, unspecified, left ear: Secondary | ICD-10-CM

## 2022-04-13 MED ORDER — DOXYCYCLINE HYCLATE 100 MG PO TABS: 100.0000 mg | ORAL_TABLET | Freq: Two times a day (BID) | ORAL | 0 refills | Status: DC

## 2022-04-13 NOTE — Progress Notes (Signed)
Subjective:     Patient ID: Kristine Mcclure, female    DOB: 08-24-1981, 41 y.o.   MRN: [id number]  Chief Complaint  Patient presents with   Follow-up    Pt c/o tinnitus, full feeling and decrease hearing. Pt states it has not cleared up at all.     HPI: Ear pain - pt describes a 1 week history of Left ear pain with fullness, hearing loss. Denies associated discharge, or dizziness, or fever. pt seen in office by different provider and given AMOX, has taken for 6 days, and then Augmentin last visit, pt reports pain is less, but still has occasional brief episodes of pain, tinnitus, fullness, and decreased hearing.   Assessment & Plan:   Problem List Items Addressed This Visit   None Visit Diagnoses     Acute left otitis media    -  Primary improved, but still not healed, pt w/sx, sending 5 day round of DOXY, advised on use & SE, & that the fullness, popping sensation may continue a few more weeks, but she should not have pain.    Relevant Medications   doxycycline (VIBRA-TABS) 100 MG tablet      Outpatient Medications Prior to Visit  Medication Sig Dispense Refill   prenatal vitamin w/FE, FA (PRENATAL 1 + 1) 27-1 MG TABS Take 1 tablet by mouth at bedtime.      triamcinolone cream (KENALOG) 0.1 % Apply 1 application. topically 2 (two) times daily.     amoxicillin-clavulanate (AUGMENTIN) 875-125 MG tablet Take 1 tablet by mouth 2 (two) times daily. 14 tablet 0   benzonatate (TESSALON PERLES) 100 MG capsule Take 1 capsule (100 mg total) by mouth 3 (three) times daily as needed for cough. 20 capsule 0   No facility-administered medications prior to visit.    Past Medical History:  Diagnosis Date   Blood transfusion without reported diagnosis 08/08/2020   Preterm delivery without spontaneous labor 08/02/2011   Preterm labor     Past Surgical History:  Procedure Laterality Date   CESAREAN SECTION N/A 08/08/2020   Procedure: CESAREAN SECTION;  Surgeon: Noland Fordyce,  MD;  Location: [facility];  Service: Obstetrics;  Laterality: N/A;   DILATION AND EVACUATION N/A 08/08/2020   Procedure: DILATATION AND EVACUATION;  Surgeon: Noland Fordyce, MD;  Location: [facility];  Service: Gynecology;  Laterality: N/A;   LAPAROTOMY N/A 08/08/2020   Procedure: EXPLORATORY LAPAROTOMY;  Surgeon: Noland Fordyce, MD;  Location: [facility];  Service: Gynecology;  Laterality: N/A;   NO PAST SURGERIES     WISDOM TOOTH EXTRACTION      No Known Allergies     Objective:    Physical Exam Vitals and nursing note reviewed.  Constitutional:      Appearance: Normal appearance.  HENT:     Right Ear: Tympanic membrane and ear canal normal.     Left Ear: External ear normal. Decreased hearing noted. Tympanic membrane is erythematous (mild, improved). Tympanic membrane is not injected or bulging.  Cardiovascular:     Rate and Rhythm: Normal rate and regular rhythm.  Pulmonary:     Effort: Pulmonary effort is normal.     Breath sounds: Normal breath sounds.  Musculoskeletal:        General: Normal range of motion.  Skin:    General: Skin is warm and dry.  Neurological:     Mental Status: She is alert.  Psychiatric:        Mood and Affect: Mood normal.  Behavior: Behavior normal.     BP 124/90 (BP Location: Left Arm, Patient Position: Sitting, Cuff Size: Large)   Pulse 74   Temp 98.1 F (36.7 C) (Temporal)   Ht 5\' 2"  (1.575 m)   Wt 189 lb 8 oz (86 kg)   LMP 03/04/2022 (Exact Date)   SpO2 96%   Breastfeeding Yes   BMI 34.66 kg/m  Wt Readings from Last 3 Encounters:  04/13/22 189 lb 8 oz (86 kg)  04/02/22 191 lb 4 oz (86.8 kg)  03/27/22 188 lb 12.8 oz (85.6 kg)        Meds ordered this encounter  Medications   doxycycline (VIBRA-TABS) 100 MG tablet    Sig: Take 1 tablet (100 mg total) by mouth 2 (two) times daily.    Dispense:  10 tablet    Refill:  0    Order Specific Question:   Supervising Provider    Answer:   ANDY, CAMILLE L [[id number]]     03/29/22, NP

## 2022-12-21 ENCOUNTER — Ambulatory Visit (INDEPENDENT_AMBULATORY_CARE_PROVIDER_SITE_OTHER): Payer: Commercial Managed Care - PPO | Admitting: Family

## 2022-12-21 ENCOUNTER — Encounter: Payer: Self-pay | Admitting: Family

## 2022-12-21 VITALS — Temp 98.0°F | Ht 62.0 in | Wt 189.4 lb

## 2022-12-21 DIAGNOSIS — J0110 Acute frontal sinusitis, unspecified: Secondary | ICD-10-CM

## 2022-12-21 MED ORDER — AMOXICILLIN-POT CLAVULANATE 875-125 MG PO TABS: 1.0000 | ORAL_TABLET | Freq: Two times a day (BID) | ORAL | 0 refills | Status: DC

## 2022-12-21 NOTE — Progress Notes (Signed)
Patient ID: Kristine Mcclure, female    DOB: March 19, 1981, 42 y.o.   MRN: [id number]  Chief Complaint  Patient presents with   Sinus Problem    Pt c/o sinus pressure, cough with clear mucus,  runny/nose congestion(green), headaches. Present for about 2 weeks around 2/2. Has tried tylenol which did for while.    HPI:      Sinusitis:  Pt c/o sinus pressure, cough with clear mucus,  runny/nose congestion(green), headaches, sinus pressure. Present for almost 2 weeks around 3/1. Has tried tylenol which did for while, and tylenol cold medicine.      Assessment & Plan:  1. Acute non-recurrent frontal sinusitis - sending Augmentin, advised on use & SE, safe for breast-feeding, advised on possible SE to baby to watch for. Advised on using generic sudafed OTC for 5-7d, or generic steroid nasal spray if sx continue past 1 week. Use saline nasal spray tid & prior to med spray.  - amoxicillin-clavulanate (AUGMENTIN) 875-125 MG tablet; Take 1 tablet by mouth 2 (two) times daily after a meal.  Dispense: 10 tablet; Refill: 0  Subjective:    Outpatient Medications Prior to Visit  Medication Sig Dispense Refill   doxycycline (VIBRA-TABS) 100 MG tablet Take 1 tablet (100 mg total) by mouth 2 (two) times daily. 10 tablet 0   prenatal vitamin w/FE, FA (PRENATAL 1 + 1) 27-1 MG TABS Take 1 tablet by mouth at bedtime.      triamcinolone cream (KENALOG) 0.1 % Apply 1 application. topically 2 (two) times daily.     No facility-administered medications prior to visit.   Past Medical History:  Diagnosis Date   Blood transfusion without reported diagnosis 08/08/2020   Preterm delivery without spontaneous labor 08/02/2011   Preterm labor    Past Surgical History:  Procedure Laterality Date   CESAREAN SECTION N/A 08/08/2020   Procedure: CESAREAN SECTION;  Surgeon: Aloha Gell, MD;  Location: [facility];  Service: Obstetrics;  Laterality: N/A;   DILATION AND EVACUATION N/A 08/08/2020   Procedure:  DILATATION AND EVACUATION;  Surgeon: Aloha Gell, MD;  Location: [facility];  Service: Gynecology;  Laterality: N/A;   LAPAROTOMY N/A 08/08/2020   Procedure: EXPLORATORY LAPAROTOMY;  Surgeon: Aloha Gell, MD;  Location: [facility];  Service: Gynecology;  Laterality: N/A;   NO PAST SURGERIES     WISDOM TOOTH EXTRACTION     No Known Allergies    Objective:    Physical Exam Vitals and nursing note reviewed.  Constitutional:      Appearance: Normal appearance. She is ill-appearing.     Interventions: Face mask in place.  HENT:     Right Ear: Tympanic membrane and ear canal normal.     Left Ear: Tympanic membrane and ear canal normal.     Nose:     Right Sinus: Frontal sinus tenderness present.     Left Sinus: Frontal sinus tenderness present.     Mouth/Throat:     Mouth: Mucous membranes are moist.     Pharynx: Posterior oropharyngeal erythema present. No pharyngeal swelling, oropharyngeal exudate or uvula swelling.     Tonsils: No tonsillar exudate or tonsillar abscesses.  Cardiovascular:     Rate and Rhythm: Normal rate and regular rhythm.  Pulmonary:     Effort: Pulmonary effort is normal.     Breath sounds: Normal breath sounds.  Musculoskeletal:        General: Normal range of motion.  Lymphadenopathy:     Head:  Right side of head: No preauricular or posterior auricular adenopathy.     Left side of head: No preauricular or posterior auricular adenopathy.     Cervical: No cervical adenopathy.  Skin:    General: Skin is warm and dry.  Neurological:     Mental Status: She is alert.  Psychiatric:        Mood and Affect: Mood normal.        Behavior: Behavior normal.    Temp 98 F (36.7 C) (Temporal)   Ht '5\' 2"'$  (1.575 m)   Wt 189 lb 6 oz (85.9 kg)   LMP 11/25/2022 (Exact Date)   Breastfeeding Yes   BMI 34.64 kg/m  Wt Readings from Last 3 Encounters:  12/21/22 189 lb 6 oz (85.9 kg)  04/13/22 189 lb 8 oz (86 kg)  04/02/22 191 lb 4 oz (86.8 kg)        Jeanie Sewer, NP

## 2022-12-24 ENCOUNTER — Encounter: Payer: Commercial Managed Care - PPO | Admitting: Family

## 2022-12-29 ENCOUNTER — Ambulatory Visit (INDEPENDENT_AMBULATORY_CARE_PROVIDER_SITE_OTHER): Payer: Commercial Managed Care - PPO | Admitting: Family

## 2022-12-29 ENCOUNTER — Encounter: Payer: Self-pay | Admitting: Family

## 2022-12-29 VITALS — BP 115/80 | HR 67 | Temp 97.1°F | Ht 62.0 in | Wt 191.4 lb

## 2022-12-29 DIAGNOSIS — Z800 Family history of malignant neoplasm of digestive organs: Secondary | ICD-10-CM | POA: Diagnosis not present

## 2022-12-29 DIAGNOSIS — Z23 Encounter for immunization: Secondary | ICD-10-CM

## 2022-12-29 DIAGNOSIS — Z0000 Encounter for general adult medical examination without abnormal findings: Secondary | ICD-10-CM

## 2022-12-29 LAB — CBC WITH DIFFERENTIAL/PLATELET
Basophils Absolute: 0.1 10*3/uL (ref 0.0–0.1)
Basophils Relative: 1.1 % (ref 0.0–3.0)
Eosinophils Absolute: 0.1 10*3/uL (ref 0.0–0.7)
Eosinophils Relative: 1.3 % (ref 0.0–5.0)
HCT: 40.7 % (ref 36.0–46.0)
Hemoglobin: 13.7 g/dL (ref 12.0–15.0)
Lymphocytes Relative: 32 % (ref 12.0–46.0)
Lymphs Abs: 2.2 10*3/uL (ref 0.7–4.0)
MCHC: 33.6 g/dL (ref 30.0–36.0)
MCV: 85.7 fl (ref 78.0–100.0)
Monocytes Absolute: 0.5 10*3/uL (ref 0.1–1.0)
Monocytes Relative: 7.6 % (ref 3.0–12.0)
Neutro Abs: 3.9 10*3/uL (ref 1.4–7.7)
Neutrophils Relative %: 58 % (ref 43.0–77.0)
Platelets: 420 10*3/uL — ABNORMAL HIGH (ref 150.0–400.0)
RBC: 4.75 Mil/uL (ref 3.87–5.11)
RDW: 13.3 % (ref 11.5–15.5)
WBC: 6.8 10*3/uL (ref 4.0–10.5)

## 2022-12-29 LAB — COMPREHENSIVE METABOLIC PANEL
ALT: 12 U/L (ref 0–35)
AST: 13 U/L (ref 0–37)
Albumin: 4.4 g/dL (ref 3.5–5.2)
Alkaline Phosphatase: 51 U/L (ref 39–117)
BUN: 11 mg/dL (ref 6–23)
CO2: 26 mEq/L (ref 19–32)
Calcium: 10.2 mg/dL (ref 8.4–10.5)
Chloride: 101 mEq/L (ref 96–112)
Creatinine, Ser: 0.8 mg/dL (ref 0.40–1.20)
GFR: 91.79 mL/min (ref >60.00)
Glucose, Bld: 90 mg/dL (ref 70–99)
Potassium: 5.1 mEq/L (ref 3.5–5.1)
Sodium: 138 mEq/L (ref 135–145)
Total Bilirubin: 0.5 mg/dL (ref 0.2–1.2)
Total Protein: 7.4 g/dL (ref 6.0–8.3)

## 2022-12-29 LAB — LIPID PANEL
Cholesterol: 85 mg/dL (ref 0–200)
HDL: 46.7 mg/dL (ref >39.00)
LDL Cholesterol: 34 mg/dL (ref 0–99)
NonHDL: 38.25
Total CHOL/HDL Ratio: 2
Triglycerides: 23 mg/dL (ref 0.0–149.0)
VLDL: 4.6 mg/dL (ref 0.0–40.0)

## 2022-12-29 LAB — TSH: TSH: 1.65 u[IU]/mL (ref 0.35–5.50)

## 2022-12-29 NOTE — Progress Notes (Signed)
Phone [phone]  Subjective:   Patient is a 42 y.o. female presenting for annual physical.    Chief Complaint  Patient presents with   Annual Exam    Fasting w/ Labs   See problem oriented charting- ROS- full  review of systems was completed and negative.  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   Postpartum hemorrhage - 3000cc 08/10/2020   Maternal anemia, with delivery - ABL 08/10/2020   Delivery by emergency cesarean 08/09/2020   Postpartum care following cesarean delivery 10/28 08/08/2020   Encounter for induction of labor 08/07/2020   Rubella non-immune status, antepartum 11/08/2015   History of preterm delivery--PPROM at 33 weeks, pre-eclampsia 11/08/2015   H/O severe pre-eclampsia 11/08/2015   Preeclampsia 07/27/2011   Past Surgical History:  Procedure Laterality Date   CESAREAN SECTION N/A 08/08/2020   Procedure: CESAREAN SECTION;  Surgeon: Aloha Gell, MD;  Location: [facility];  Service: Obstetrics;  Laterality: N/A;   DILATION AND EVACUATION N/A 08/08/2020   Procedure: DILATATION AND EVACUATION;  Surgeon: Aloha Gell, MD;  Location: [facility];  Service: Gynecology;  Laterality: N/A;   LAPAROTOMY N/A 08/08/2020   Procedure: EXPLORATORY LAPAROTOMY;  Surgeon: Aloha Gell, MD;  Location: [facility];  Service: Gynecology;  Laterality: N/A;   NO PAST SURGERIES     WISDOM TOOTH EXTRACTION      Family History  Problem Relation Age of Onset   Heart disease Maternal Grandfather    Diabetes Maternal Grandfather    Cancer Maternal Grandfather    Hearing loss Maternal Grandfather    Diabetes Paternal Grandmother    Diabetes Paternal Grandfather     Cancer Paternal Grandfather    Hearing loss Maternal Grandmother     Medications- reviewed and updated No current outpatient medications on file.   No current facility-administered medications for this visit.   Allergies-reviewed and updated No Known Allergies  Social History   Social History Narrative   Not on file    Objective:  BP 115/80 (BP Location: Left Arm, Patient Position: Sitting, Cuff Size: Large)   Pulse 67   Temp (!) 97.1 F (36.2 C) (Temporal)   Ht 5\' 2"  (1.575 m)   Wt 191 lb 6.4 oz (86.8 kg)   LMP 11/25/2022 (Exact Date)   SpO2 97%   Breastfeeding Yes   BMI 35.01 kg/m  Physical Exam Vitals and nursing note reviewed.  Constitutional:      Appearance: Normal appearance.  HENT:     Head: Normocephalic.     Right Ear: A middle ear effusion (mild serous effusion) is present.     Left Ear: Tympanic membrane normal.     Nose: Nose normal.     Mouth/Throat:     Mouth: Mucous membranes are moist.  Eyes:     Pupils: Pupils are equal, round, and reactive to light.  Cardiovascular:     Rate and Rhythm: Normal rate and regular rhythm.  Pulmonary:     Effort: Pulmonary effort is normal.     Breath sounds: Normal breath sounds.  Musculoskeletal:        General: Normal range of motion.     Cervical back: Normal range of motion.  Lymphadenopathy:  Cervical: No cervical adenopathy.  Skin:    General: Skin is warm and dry.  Neurological:     Mental Status: She is alert.  Psychiatric:        Mood and Affect: Mood normal.        Behavior: Behavior normal.     Assessment and Plan   Health Maintenance counseling: 1. Anticipatory guidance: Patient counseled regarding regular dental exams q6 months, eye exams,  avoiding smoking and second hand smoke, limiting alcohol to 1 beverage per day, no illicit drugs.   2. Risk factor reduction:  Advised patient of need for regular exercise and diet rich with fruits and vegetables to reduce risk of heart attack and  stroke. Exercise- walking.  Wt Readings from Last 3 Encounters:  12/29/22 191 lb 6.4 oz (86.8 kg)  12/21/22 189 lb 6 oz (85.9 kg)  04/13/22 189 lb 8 oz (86 kg)   3. Immunizations/screenings/ancillary studies Immunization History  Administered Date(s) Administered   MMR 08/02/2011, 12/29/2022   Tdap 08/02/2011, 02/26/2021   Health Maintenance Due  Topic Date Due   COVID-19 Vaccine (1) Never done    4. Cervical cancer screening- due 12/24 5. Breast cancer screening-  mammogram - will schedule with OB 6. Colon cancer screening - ordering today 7. Skin cancer screening- advised regular sunscreen use. Denies worrisome, changing, or new skin lesions.  8. Birth control/STD check- N/A  9. Osteoporosis screening- N/A 10. Alcohol screening: none  11. Smoking associated screening (lung cancer screening, AAA screen 65-75, UA)- non- smoker  Annual physical exam -     Comprehensive metabolic panel -     TSH -     Lipid panel -     CBC with Differential/Platelet  Family history of colon cancer -     Ambulatory referral to Gastroenterology  Need for MMR vaccine -     MMR vaccine subcutaneous   Recommended follow up:  No follow-ups on file. No future appointments.  Lab/Order associations: fasting   Jeanie Sewer, NP

## 2022-12-29 NOTE — Patient Instructions (Signed)
It was very nice to see you today!   I will review your lab results via MyChart in a few days.   Have a great week!    PLEASE NOTE:  If you had any lab tests please let us know if you have not heard back within a few days. You may see your results on MyChart before we have a chance to review them but we will give you a call once they are reviewed by us. If we ordered any referrals today, please let us know if you have not heard from their office within the next week.    

## 2022-12-30 LAB — MEASLES/MUMPS/RUBELLA IMMUNITY
Mumps IgG: 34 AU/mL
Rubella: <0.9 Index — ABNORMAL LOW
Rubeola IgG: <13.5 AU/mL — ABNORMAL LOW

## 2022-12-31 ENCOUNTER — Encounter: Payer: Self-pay | Admitting: Family

## 2022-12-31 NOTE — Progress Notes (Signed)
Your glucose (sugar), electrolytes, kidney and liver function,  thyroid, and cholesterol numbers are all great.  Your blood count indicates your Platelet count has shot up in the opposite direction over the last 2 years, this can be just from building back up but this is actually not too high by hematology lab standards. But we can recheck your level again in a month to be sure it is not still elevated.This can be a non-fasting, lab only appointment. Keep up the good work with controlling your diet and continue to try and shoot for 30 minutes of exercise daily!

## 2023-04-07 ENCOUNTER — Ambulatory Visit (INDEPENDENT_AMBULATORY_CARE_PROVIDER_SITE_OTHER): Payer: Commercial Managed Care - PPO | Admitting: Family

## 2023-04-07 VITALS — BP 117/80 | HR 69 | Temp 98.0°F | Ht 62.0 in | Wt 193.4 lb

## 2023-04-07 DIAGNOSIS — H9202 Otalgia, left ear: Secondary | ICD-10-CM

## 2023-04-07 DIAGNOSIS — R7989 Other specified abnormal findings of blood chemistry: Secondary | ICD-10-CM | POA: Diagnosis not present

## 2023-04-07 LAB — CBC WITH DIFFERENTIAL/PLATELET
Basophils Absolute: 0.1 10*3/uL (ref 0.0–0.1)
Basophils Relative: 1.2 % (ref 0.0–3.0)
Eosinophils Absolute: 0.2 10*3/uL (ref 0.0–0.7)
Eosinophils Relative: 2.3 % (ref 0.0–5.0)
HCT: 41.7 % (ref 36.0–46.0)
Hemoglobin: 13.6 g/dL (ref 12.0–15.0)
Lymphocytes Relative: 30.2 % (ref 12.0–46.0)
Lymphs Abs: 2.4 10*3/uL (ref 0.7–4.0)
MCHC: 32.6 g/dL (ref 30.0–36.0)
MCV: 86.2 fl (ref 78.0–100.0)
Monocytes Absolute: 0.6 10*3/uL (ref 0.1–1.0)
Monocytes Relative: 7.8 % (ref 3.0–12.0)
Neutro Abs: 4.6 10*3/uL (ref 1.4–7.7)
Neutrophils Relative %: 58.5 % (ref 43.0–77.0)
Platelets: 284 10*3/uL (ref 150.0–400.0)
RBC: 4.83 Mil/uL (ref 3.87–5.11)
RDW: 14.1 % (ref 11.5–15.5)
WBC: 7.8 10*3/uL (ref 4.0–10.5)

## 2023-04-07 NOTE — Progress Notes (Unsigned)
Patient ID: Kristine Mcclure, female    DOB: [birth year], 42 y.o.   MRN: [id number]  Chief Complaint  Patient presents with   Ear Pain    Pt c/o left ear pain/fullness for about a week, Has tried warm compresses, yawning which does not help.     HPI:      Ear fullness/pain:    Pt c/o left ear pain/fullness for about a week. Denies any sinus sx, no sore throat, no cough, di go swimming in pool just yesterday but not previously, Has tried warm compresses, & yawning which does not help.   Repeat CBC:  pt here to recheck high platelets noted in labs done in March, advised she return to redraw, denies any sx, had had some anemia just after pregnancy earlier in year.   Assessment & Plan:  1. Ear pain, left - TM wnl, no stenosis or cerumen in ear canal. Advised can try OTC generic Nasacort every day to see if helps ear fullness. Can call back if sx are still not better in another 2 weeks.  2. Abnormal CBC - platelet count elevated in April. rechecking today.   - CBC with Differential/Platelet     Subjective:    No outpatient medications prior to visit.   No facility-administered medications prior to visit.   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   Encounter for induction of labor 08/07/2020   H/O severe pre-eclampsia 11/08/2015   History of anemia 12/17/2021   History of preterm delivery--PPROM at 33 weeks, pre-eclampsia 11/08/2015   Maternal anemia, with delivery - ABL 08/10/2020   Postpartum care following cesarean delivery 10/28 08/08/2020   Postpartum hemorrhage - 3000cc 08/10/2020   Preeclampsia 07/27/2011   Preterm delivery without spontaneous labor 08/02/2011   Preterm labor    Rubella non-immune status, antepartum 11/08/2015   Past Surgical History:  Procedure Laterality Date   CESAREAN SECTION N/A 08/08/2020   Procedure:  CESAREAN SECTION;  Surgeon: Noland Fordyce, MD;  Location: [facility];  Service: Obstetrics;  Laterality: N/A;   DILATION AND EVACUATION N/A 08/08/2020   Procedure: DILATATION AND EVACUATION;  Surgeon: Noland Fordyce, MD;  Location: [facility];  Service: Gynecology;  Laterality: N/A;   LAPAROTOMY N/A 08/08/2020   Procedure: EXPLORATORY LAPAROTOMY;  Surgeon: Noland Fordyce, MD;  Location: [facility];  Service: Gynecology;  Laterality: N/A;   NO PAST SURGERIES     WISDOM TOOTH EXTRACTION     No Known Allergies    Objective:    Physical Exam Vitals and nursing note reviewed.  Constitutional:      Appearance: Normal appearance.  HENT:     Right Ear: Hearing and tympanic membrane normal. There is no impacted cerumen. Tympanic membrane is not erythematous, retracted or bulging.     Left Ear: Hearing and tympanic membrane normal. There is no impacted cerumen. Tympanic membrane is not erythematous, retracted or bulging.  Cardiovascular:     Rate and Rhythm: Normal rate and regular rhythm.  Pulmonary:     Effort: Pulmonary effort is normal.     Breath sounds: Normal breath sounds.  Musculoskeletal:        General: Normal range of motion.  Lymphadenopathy:     Head:     Right side of head: No tonsillar, preauricular, posterior auricular or occipital adenopathy.     Left side of head: No tonsillar,  preauricular, posterior auricular or occipital adenopathy.     Cervical: No cervical adenopathy.  Skin:    General: Skin is warm and dry.  Neurological:     Mental Status: She is alert.  Psychiatric:        Mood and Affect: Mood normal.        Behavior: Behavior normal.    BP 117/80 (BP Location: Left Arm)   Pulse 69   Temp 98 F (36.7 C) (Temporal)   Ht 5\' 2"  (1.575 m)   Wt 193 lb 6.4 oz (87.7 kg)   SpO2 96%   BMI 35.37 kg/m  Wt Readings from Last 3 Encounters:  04/07/23 193 lb 6.4 oz (87.7 kg)  12/29/22 191 lb 6.4 oz (86.8 kg)  12/21/22 189 lb 6 oz (85.9 kg)       Dulce Sellar, NP

## 2023-06-22 ENCOUNTER — Telehealth: Payer: Commercial Managed Care - PPO | Admitting: Family Medicine

## 2023-06-22 DIAGNOSIS — L089 Local infection of the skin and subcutaneous tissue, unspecified: Secondary | ICD-10-CM

## 2023-06-22 MED ORDER — CEPHALEXIN 500 MG PO CAPS: 500.0000 mg | ORAL_CAPSULE | Freq: Four times a day (QID) | ORAL | 0 refills | Status: AC

## 2023-06-22 MED ORDER — MUPIROCIN 2 % EX OINT: 1.0000 | TOPICAL_OINTMENT | Freq: Two times a day (BID) | CUTANEOUS | 0 refills | Status: AC

## 2023-06-22 NOTE — Progress Notes (Signed)
Virtual Visit Consent   Kristine Mcclure, you are scheduled for a virtual visit with a [facility] provider today. Just as with appointments in the office, your consent must be obtained to participate. Your consent will be active for this visit and any virtual visit you may have with one of our providers in the next 365 days. If you have a MyChart account, a copy of this consent can be sent to you electronically.  As this is a virtual visit, video technology does not allow for your provider to perform a traditional examination. This may limit your provider's ability to fully assess your condition. If your provider identifies any concerns that need to be evaluated in person or the need to arrange testing (such as labs, EKG, etc.), we will make arrangements to do so. Although advances in technology are sophisticated, we cannot ensure that it will always work on either your end or our end. If the connection with a video visit is poor, the visit may have to be switched to a telephone visit. With either a video or telephone visit, we are not always able to ensure that we have a secure connection.  By engaging in this virtual visit, you consent to the provision of healthcare and authorize for your insurance to be billed (if applicable) for the services provided during this visit. Depending on your insurance coverage, you may receive a charge related to this service.  I need to obtain your verbal consent now. Are you willing to proceed with your visit today? Kristine Mcclure has provided verbal consent on 06/22/2023 for a virtual visit (video or telephone). Freddy Finner, NP  Date: 06/22/2023 2:34 PM  Virtual Visit via Video Note   I, Freddy Finner, connected with  Kristine Mcclure  ([id number], Nov 28, 1980) on 06/22/23 at  2:30 PM EDT by a video-enabled telemedicine application and verified that I am speaking with the correct person using two identifiers.  Location: Patient: Virtual Visit Location  Patient: Home Provider: Virtual Visit Location Provider: Home Office   I discussed the limitations of evaluation and management by telemedicine and the availability of in person appointments. The patient expressed understanding and agreed to proceed.    History of Present Illness: Kristine Mcclure is a 42 y.o. who identifies as a female who was assigned female at birth, and is being seen today for insect bite-  Onset was Sunday evening- 6pm at night noted to have some soreness of the area. Monday soreness was better, but then it developed a bump- no drainage from the area, warmth Associated symptoms none outside above  Modifying factors are washed with antibacterial soap, and neosporin  Denies chest pain, shortness of breath, fevers, chills   Problems: There are no problems to display for this patient.   Allergies: No Known Allergies Medications: No current outpatient medications on file.  Observations/Objective: Patient is well-developed, well-nourished in no acute distress.  Resting comfortably  at home.  Head is normocephalic, atraumatic.  No labored breathing.  Speech is clear and coherent with logical content.  Patient is alert and oriented at baseline.  Left elbow red bump with spread of greater than half dollar  Assessment and Plan:   1. Skin infection  - mupirocin ointment (BACTROBAN) 2 %; Apply 1 Application topically 2 (two) times daily.  Dispense: 22 g; Refill: 0 - cephALEXin (KEFLEX) 500 MG capsule; Take 1 capsule (500 mg total) by mouth 4 (four) times daily for 7 days.  Dispense: 28 capsule;  Refill: 0  Start with Bactroban, if not improved in 24-48 hours or worsening- start the Keflex as directed  Reviewed UTD for breastfeeding measures  Reviewed side effects, risks and benefits of medication.    Patient acknowledged agreement and understanding of the plan.   Past Medical, Surgical, Social History, Allergies, and Medications have been  Reviewed.    Follow Up Instructions: I discussed the assessment and treatment plan with the patient. The patient was provided an opportunity to ask questions and all were answered. The patient agreed with the plan and demonstrated an understanding of the instructions.  A copy of instructions were sent to the patient via MyChart unless otherwise noted below.    The patient was advised to call back or seek an in-person evaluation if the symptoms worsen or if the condition fails to improve as anticipated.  Time:  I spent 10 minutes with the patient via telehealth technology discussing the above problems/concerns.    Freddy Finner, NP

## 2023-06-22 NOTE — Patient Instructions (Addendum)
Kristine Mcclure, thank you for joining Freddy Finner, NP for today's virtual visit.  While this provider is not your primary care provider (PCP), if your PCP is located in our provider database this encounter information will be shared with them immediately following your visit.   A [place] MyChart account gives you access to today's visit and all your visits, tests, and labs performed at [facility] " click here if you don't have a [place] MyChart account or go to [email or web address]  Consent: (Patient) Kristine Mcclure provided verbal consent for this virtual visit at the beginning of the encounter.  Current Medications:  Current Outpatient Medications:    [START ON 06/24/2023] cephALEXin (KEFLEX) 500 MG capsule, Take 1 capsule (500 mg total) by mouth 4 (four) times daily for 7 days., Disp: 28 capsule, Rfl: 0   mupirocin ointment (BACTROBAN) 2 %, Apply 1 Application topically 2 (two) times daily., Disp: 22 g, Rfl: 0   Medications ordered in this encounter:  Meds ordered this encounter  Medications   mupirocin ointment (BACTROBAN) 2 %    Sig: Apply 1 Application topically 2 (two) times daily.    Dispense:  22 g    Refill:  0    Order Specific Question:   Supervising Provider    Answer:   Merrilee Jansky [[id number]]   cephALEXin (KEFLEX) 500 MG capsule    Sig: Take 1 capsule (500 mg total) by mouth 4 (four) times daily for 7 days.    Dispense:  28 capsule    Refill:  0    Order Specific Question:   Supervising Provider    Answer:   Merrilee Jansky [[id number]]     *If you need refills on other medications prior to your next appointment, please contact your pharmacy*  Follow-Up: Call back or seek an in-person evaluation if the symptoms worsen or if the condition fails to improve as anticipated.  [facility] [phone]  Other Instructions  Insect Bite, Adult An insect bite can make your skin red, itchy, and swollen. Some insects  can spread disease to people with a bite. However, most insect bites do not lead to disease, and most are not serious. What are the causes? Insects may bite for many reasons, including: Hunger. To defend themselves. Insects that bite include: Spiders. Mosquitoes. Flies. Ticks and fleas. Ants. Kissing bugs. Chiggers. What are the signs or symptoms? Symptoms often last for 2-4 days. However, itching can last up to 10 days. Symptoms include: Itching or pain in the bite area. Redness and swelling in the bite area. An open wound. In rare cases, a person may have a very bad allergic reaction (anaphylactic reaction) to a bite. Symptoms of an anaphylactic reaction may include: Feeling warm in the face (flushed). Your face may turn red. Itchy, red, swollen areas of skin (hives). Swelling of the eyes, lips, face, mouth, tongue, or throat. Trouble with breathing, talking, or swallowing. High-pitched whistling sounds, most often when breathing out (wheezing). Feeling dizzy or light-headed. Fainting. Pain or cramps in your belly (abdomen). Vomiting. Watery poop (diarrhea). How is this treated? Most insect bites are not serious. Symptoms often go away on their own. When treatment is advised, it may include: Putting ice on the bite area. Putting a cream or lotion, like calamine lotion, on the bite area. This helps with itching. Using medicines called antihistamines. You may also need: A tetanus shot if you are not up to date. An antibiotic cream or  medicine. This treatment is needed if the bite area gets infected. Follow these instructions at home: Bite area care  Do not scratch the bite area. It may help to cover the bite area with a bandage or close-fitting clothing. Keep the bite area clean and dry. Check the bite area every day for signs of infection. Check for: More redness, swelling, or pain. Fluid or blood. Warmth. Pus or a bad smell. Wash your hands often. Managing pain,  itching, and swelling  You may put any of these on the bite area as told by your doctor: A paste made of baking soda and water. Cortisone cream. Calamine lotion. If told, put ice on the bite area. To do this: Put ice in a plastic bag. Place a towel between your skin and the bag. Leave the ice on for 20 minutes, 2-3 times a day. If your skin turns bright red, take off the ice right away to prevent skin damage. The risk of skin damage is higher if you cannot feel pain, heat, or cold. General instructions Apply or take over-the-counter and prescription medicines only as told by your doctor. If you were prescribed antibiotics, take or apply them as told by your doctor. Do not stop using them even if you start to feel better. How is this prevented? To help you have a lower risk of insect bites: When you are outside, wear clothes that cover your arms and legs. Use insect repellent. The best insect repellents contain one of these: DEET. Picaridin. Oil of lemon eucalyptus (OLE). NF6213. Consider spraying your clothing with a pesticide called permethrin. Permethrin helps prevent insect bites. It works for several weeks and for up to 5-6 clothing washes. Do not apply permethrin directly to the skin. If your home windows do not have screens, think about putting some in. If you will be sleeping in an area where there are mosquitoes, consider covering your sleeping area with a mosquito net. Contact a doctor if: You have redness, swelling, or pain in the bite area. You have fluid or blood coming from the bite area. The bite area feels warm to the touch. You have pus or a bad smell coming from the bite area. You have a fever. Get help right away if: You have joint pain. You have a rash. You feel weak or more tired than you normally do. You have neck pain or a headache. You have signs of an anaphylactic reaction. Signs may include: Swelling of your eyes, lips, face, mouth, tongue, or  throat. Feeling warm in the face. Itchy, red, swollen areas of skin. Trouble with breathing, talking, or swallowing. Wheezing. Feeling dizzy or light-headed. Fainting. Pain or cramps in your belly. Vomiting or watery poop. These symptoms may be an emergency. Get help right away. Call 911. Do not wait to see if symptoms will go away. Do not drive yourself to the hospital. Summary An insect bite can make your skin red, itchy, and swollen. Treatment is usually not needed. Symptoms often go away on their own. Do not scratch the bite area. Keep it clean and dry. Use insect repellent to help prevent insect bites. Contact a doctor if you have signs of infection. This information is not intended to replace advice given to you by your health care provider. Make sure you discuss any questions you have with your health care provider. Document Revised: 12/23/2021 Document Reviewed: 12/23/2021 Elsevier Patient Education  2024 [employer].    If you have been instructed to have an in-person  evaluation today at a local Urgent Care facility, please use the link below. It will take you to a list of all of our available [facility], including address, phone number and hours of operation. Please do not delay care.  [facility]  If you or a family member do not have a primary care provider, use the link below to schedule a visit and establish care. When you choose a [facility] primary care physician or advanced practice provider, you gain a long-term partner in health. Find a Primary Care Provider  Learn more about [facility]'s in-office and virtual care options: [facility] - Get Care Now

## 2023-12-07 ENCOUNTER — Other Ambulatory Visit: Payer: Self-pay | Admitting: Obstetrics and Gynecology

## 2023-12-07 DIAGNOSIS — Z1231 Encounter for screening mammogram for malignant neoplasm of breast: Secondary | ICD-10-CM

## 2024-10-09 ENCOUNTER — Encounter: Payer: Self-pay | Admitting: Physician Assistant

## 2024-10-09 ENCOUNTER — Ambulatory Visit: Admitting: Physician Assistant

## 2024-10-09 VITALS — BP 120/78 | HR 80

## 2024-10-09 DIAGNOSIS — N9089 Other specified noninflammatory disorders of vulva and perineum: Secondary | ICD-10-CM | POA: Diagnosis not present

## 2024-10-09 DIAGNOSIS — L299 Pruritus, unspecified: Secondary | ICD-10-CM

## 2024-10-09 MED ORDER — CLOBETASOL PROPIONATE 0.05 % EX OINT: TOPICAL_OINTMENT | CUTANEOUS | 1 refills | Status: DC

## 2024-10-09 MED ORDER — CLOBETASOL PROPIONATE 0.05 % EX OINT: TOPICAL_OINTMENT | CUTANEOUS | 1 refills | Status: AC

## 2024-10-09 NOTE — Patient Instructions (Addendum)

## 2024-10-09 NOTE — Progress Notes (Signed)
" ° °  New Patient Visit   Subjective  Kristine Mcclure is a 43 y.o. female NEW PATIENT who presents for the following: Rash.  Located at the Vulva/Perineum that she would like to have examined.  Patient reports the areas have been there for 3 years.  She reports the areas are bothersome. Patient rates irritation 9 out of 10.  She states that the areas have spread.  Patient reports she uses a sensitive skin soap  Patient reports she has previously been treated for these areas. Patient reports that the area has previously had a bx performed by her gyn for lichen sclerosis, report was negative per patient  Patient reports she was previously prescribed Clobetasol ointment and reports that it seemed to help with symptoms Patient reports she uses OTC hydrocortisone cream and aquaphor Patient reports that these only provide temporary relief      The following portions of the chart were reviewed this encounter and updated as appropriate: medications, allergies, medical history  Review of Systems:  No other skin or systemic complaints except as noted in HPI or Assessment and Plan.  Objective  Well appearing patient in no apparent distress; mood and affect are within normal limits.  A focused examination was performed of the following areas: Vulva/Perineum/peri-anally   Relevant exam findings are noted in the Assessment and Plan.    Assessment & Plan  Pruritus / Vulvar irritation  Prescribed Clobetasol ointment 0.05% to apply twice daily as needed PRN for irritation Recommended to use laundry soap without dyes or scents and to discontinue use of any fabric softeners - Recommended Attitude detergent Recommended use of Vanicream body wash to use on area - photos of products placed in AVS Recommended wearing boy short style underwear to alleviate elastic from underwear to irritate skin  VULVAR IRRITATION   PRURITUS    Return in about 3 months (around 01/07/2025) for perineal  rash & TBSE.  I, Lyle Cords, am acting as a neurosurgeon for Kristine Monaco K, PA-C .   Documentation: I have reviewed the above documentation for accuracy and completeness, and I agree with the above.  Kristine Fromer K, PA-C     "

## 2024-12-12 ENCOUNTER — Encounter: Admitting: Family

## 2025-01-01 ENCOUNTER — Ambulatory Visit: Admitting: Physician Assistant
